# Patient Record
Sex: Male | Born: 1955 | Race: White | Hispanic: No | Marital: Married | Smoking: Current every day smoker
Health system: Southern US, Community
[De-identification: ages and names within clinical notes are randomized; demographics above are authoritative.]

## PROBLEM LIST (undated history)

## (undated) DIAGNOSIS — G4730 Sleep apnea, unspecified: Secondary | ICD-10-CM

## (undated) DIAGNOSIS — I2510 Atherosclerotic heart disease of native coronary artery without angina pectoris: Secondary | ICD-10-CM

## (undated) DIAGNOSIS — I209 Angina pectoris, unspecified: Secondary | ICD-10-CM

## (undated) DIAGNOSIS — I10 Essential (primary) hypertension: Secondary | ICD-10-CM

## (undated) DIAGNOSIS — K759 Inflammatory liver disease, unspecified: Secondary | ICD-10-CM

## (undated) DIAGNOSIS — M1990 Unspecified osteoarthritis, unspecified site: Secondary | ICD-10-CM

## (undated) HISTORY — PX: CARDIAC CATHETERIZATION: SHX172

## (undated) HISTORY — PX: JOINT REPLACEMENT: SHX530

## (undated) HISTORY — PX: TONSILLECTOMY: SUR1361

---

## 2021-08-30 ENCOUNTER — Other Ambulatory Visit: Payer: Self-pay

## 2021-08-30 ENCOUNTER — Encounter: Payer: Self-pay | Admitting: Family

## 2021-08-30 ENCOUNTER — Ambulatory Visit (INDEPENDENT_AMBULATORY_CARE_PROVIDER_SITE_OTHER): Payer: BC Managed Care – PPO | Admitting: Orthopaedic Surgery

## 2021-08-30 ENCOUNTER — Ambulatory Visit: Payer: Self-pay

## 2021-08-30 VITALS — Ht 67.5 in | Wt 215.0 lb

## 2021-08-30 DIAGNOSIS — M1712 Unilateral primary osteoarthritis, left knee: Secondary | ICD-10-CM | POA: Diagnosis not present

## 2021-08-30 DIAGNOSIS — G8929 Other chronic pain: Secondary | ICD-10-CM

## 2021-08-31 DIAGNOSIS — M1712 Unilateral primary osteoarthritis, left knee: Secondary | ICD-10-CM | POA: Insufficient documentation

## 2021-08-31 NOTE — Progress Notes (Signed)
Office Visit Note   Patient: David Livingston           Date of Birth: Nov 10, 1955           MRN: [id number] Visit Date: 08/30/2021              Requested by: No referring provider defined for this encounter. PCP: No primary care provider on file.   Assessment & Plan: Visit Diagnoses:  1. Primary osteoarthritis of left knee     Plan: I did review the x-rays with Kamrin and he does have bone-on-bone of the medial compartment with evidence of arthritis in the patellofemoral compartment as well.  There is mild degenerative changes of the lateral compartment.  Symptoms are primarily medial.  He is hopeful that a partial knee replacement is an option.  Given failure of conservative management he has elected to move forward with a partial versus a total knee replacement.  In order to determine this I will need an MRI first for presurgical planning.  He understands that if there is any evidence of DJD outside the medial compartment then a total knee replacement would be the most predictable and reliable in terms of pain relief and minimizing risk of revision surgery.  I will contact the patient once the MRI is available to discuss further steps.  Follow-Up Instructions: No follow-ups on file.   Orders:  Orders Placed This Encounter  Procedures   XR Knee 1-2 Views Left   MR Knee Left w/o contrast   No orders of the defined types were placed in this encounter.     Procedures: No procedures performed   Clinical Data: No additional findings.   Subjective: No chief complaint on file.   David Livingston is a very pleasant 66 year old gentleman who comes in to be evaluated for chronic severe left knee pain.  He underwent a partial medial knee replacement about 15 years ago in [place] and has done well from this.  He states that his left knee primarily hurts on the medial side but there is pain throughout the knee as well especially with increased activity.  He is hoping that a partial knee replacement is an  option for his left knee.  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  He is significantly limited by the pain for the left knee.  He is not interested in continuing medications or doing cortisone injections as these were ineffective in the right knee.   Review of Systems  Constitutional: Negative.   All other systems reviewed and are negative.   Objective: Vital Signs: Ht 5' 7.5" (1.715 m)    Wt 215 lb (97.5 kg)    BMI 33.18 kg/m   Physical Exam Vitals and nursing note reviewed.  Constitutional:      Appearance: He is well-developed.  Pulmonary:     Effort: Pulmonary effort is normal.  Abdominal:     Palpations: Abdomen is soft.  Skin:    General: Skin is warm.  Neurological:     Mental Status: He is alert and oriented to person, place, and time.  Psychiatric:        Behavior: Behavior normal.        Thought Content: Thought content normal.        Judgment: Judgment normal.    Ortho Exam  Examination of the left knee shows slight varus alignment.  No significant joint line tenderness.  There is patellofemoral crepitus with range of motion.  Range of motion is slightly  restricted.  Collaterals and cruciates are stable.  Specialty Comments:  No specialty comments available.  Imaging: No results found.   PMFS History: Patient Active Problem List   Diagnosis Date Noted   Primary osteoarthritis of left knee 08/31/2021   History reviewed. No pertinent past medical history.  History reviewed. No pertinent family history.  History reviewed. No pertinent surgical history. Social History   Occupational History   Not on file  Tobacco Use   Smoking status: Not on file   Smokeless tobacco: Not on file  Substance and Sexual Activity   Alcohol use: Not on file   Drug use: Not on file   Sexual activity: Not on file

## 2021-09-02 ENCOUNTER — Encounter: Payer: Self-pay | Admitting: Orthopaedic Surgery

## 2021-09-19 ENCOUNTER — Ambulatory Visit
Admission: RE | Admit: 2021-09-19 | Discharge: 2021-09-19 | Disposition: A | Discharge status: Home / Self Care | Payer: BC Managed Care – PPO | Source: Ambulatory Visit | Attending: Orthopaedic Surgery | Admitting: Orthopaedic Surgery

## 2021-09-19 ENCOUNTER — Other Ambulatory Visit: Payer: Self-pay

## 2021-09-19 DIAGNOSIS — M1712 Unilateral primary osteoarthritis, left knee: Secondary | ICD-10-CM

## 2021-10-21 ENCOUNTER — Encounter: Payer: Self-pay | Admitting: Orthopaedic Surgery

## 2021-10-21 NOTE — Telephone Encounter (Signed)
The labs are viewable in the care everywhere tab. ?

## 2021-10-23 NOTE — Pre-Procedure Instructions (Signed)
Surgical Instructions ? ? ? Your procedure is scheduled on Monday, March 27. ? Report to [facility] Main Entrance "A" at 8:00 A.M., then check in with the Admitting office. ? Call this number if you have problems the morning of surgery: ? [phone] ? ? If you have any questions prior to your surgery date call [phone]: Open Monday-Friday 8am-4pm ? ? ? Remember: ? Do not eat after midnight the night before your surgery ? ?You may drink clear liquids until 7:00AM the morning of your surgery.   ?Clear liquids allowed are: Water, Non-Citrus Juices (without pulp), Carbonated Beverages, Clear Tea, Black Coffee ONLY (NO MILK, CREAM OR POWDERED CREAMER of any kind), and Gatorade ? ?Patient Instructions ? ?The night before surgery:  ?No food after midnight. ONLY clear liquids after midnight ? ?The day of surgery (if you do NOT have diabetes):  ?Drink ONE (1) Pre-Surgery Clear Ensure by 7:00AM the morning of surgery. Drink in one sitting. Do not sip.  ?This drink was given to you during your hospital  ?pre-op appointment visit. ? ?Nothing else to drink after completing the  ?Pre-Surgery Clear Ensure. ? ?  ? Take these medicines the morning of surgery with A SIP OF WATER:  ? ?amLODipine (NORVASC) ?ezetimibe (ZETIA)  ?fluticasone (KLS ALLER-FLO)  if needed ? ?As of today, STOP taking any Aspirin (unless otherwise instructed by your surgeon) Aleve, Naproxen, Ibuprofen, Motrin, Advil, Goody's, BC's, all herbal medications, fish oil, and all vitamins. ? ?         ?Do not wear jewelry or makeup ?Do not wear lotions, powders, perfumes/colognes, or deodorant. ?Do not shave 48 hours prior to surgery.  Men may shave face and neck. ?Do not bring valuables to the hospital. ?Do not wear nail polish, gel polish, artificial nails, or any other type of covering on natural nails (fingers and toes) ?If you have artificial nails or gel coating that need to be removed by a nail salon, please have this removed prior to surgery. Artificial  nails or gel coating may interfere with anesthesia's ability to adequately monitor your vital signs. ? ?[facility] is not responsible for any belongings or valuables. .  ? ?Do NOT Smoke (Tobacco/Vaping)  24 hours prior to your procedure ? ?If you use a CPAP at night, you may bring your mask for your overnight stay. ?  ?Contacts, glasses, hearing aids, dentures or partials may not be worn into surgery, please bring cases for these belongings ?  ?For patients admitted to the hospital, discharge time will be determined by your treatment team. ?  ?Patients discharged the day of surgery will not be allowed to drive home, and someone needs to stay with them for 24 hours. ? ?NO VISITORS WILL BE ALLOWED IN PRE-OP WHERE PATIENTS ARE PREPPED FOR SURGERY.  ONLY 1 SUPPORT PERSON MAY BE PRESENT IN THE WAITING ROOM WHILE YOU ARE IN SURGERY.  IF YOU ARE TO BE ADMITTED, ONCE YOU ARE IN YOUR ROOM YOU WILL BE ALLOWED TWO (2) VISITORS. 1 (ONE) VISITOR MAY STAY OVERNIGHT BUT MUST ARRIVE TO THE ROOM BY 8pm.  Minor children may have two parents present. Special consideration for safety and communication needs will be reviewed on a case by case basis. ? ?Special instructions:   ? ?Oral Hygiene is also important to reduce your risk of infection.  Remember - BRUSH YOUR TEETH THE MORNING OF SURGERY WITH YOUR REGULAR TOOTHPASTE ? ? ?[facility]- Preparing For Surgery ? ?Before surgery, you can play an important  role. Because skin is not sterile, your skin needs to be as free of germs as possible. You can reduce the number of germs on your skin by washing with CHG (chlorahexidine gluconate) Soap before surgery.  CHG is an antiseptic cleaner which kills germs and bonds with the skin to continue killing germs even after washing.   ? ? ?Please do not use if you have an allergy to CHG or antibacterial soaps. If your skin becomes reddened/irritated stop using the CHG.  ?Do not shave (including legs and underarms) for at least 48 hours prior to  first CHG shower. It is OK to shave your face. ? ?Please follow these instructions carefully. ?  ? ? Shower the NIGHT BEFORE SURGERY and the MORNING OF SURGERY with CHG Soap.  ? If you chose to wash your hair, wash your hair first as usual with your normal shampoo. After you shampoo, rinse your hair and body thoroughly to remove the shampoo.  Then [employer] and genitals (private parts) with your normal soap and rinse thoroughly to remove soap. ? ?After that Use CHG Soap as you would any other liquid soap. You can apply CHG directly to the skin and wash gently with a scrungie or a clean washcloth.  ? ?Apply the CHG Soap to your body ONLY FROM THE NECK DOWN.  Do not use on open wounds or open sores. Avoid contact with your eyes, ears, mouth and genitals (private parts). Wash Face and genitals (private parts)  with your normal soap.  ? ?Wash thoroughly, paying special attention to the area where your surgery will be performed. ? ?Thoroughly rinse your body with warm water from the neck down. ? ?DO NOT shower/wash with your normal soap after using and rinsing off the CHG Soap. ? ?Pat yourself dry with a CLEAN TOWEL. ? ?Wear CLEAN PAJAMAS to bed the night before surgery ? ?Place CLEAN SHEETS on your bed the night before your surgery ? ?DO NOT SLEEP WITH PETS. ? ? ?Day of Surgery: ? ?Take a shower with CHG soap. ?Wear Clean/Comfortable clothing the morning of surgery ?Do not apply any deodorants/lotions.   ?Remember to brush your teeth WITH YOUR REGULAR TOOTHPASTE. ? ? ? ?COVID testing ? ?If you are going to stay overnight or be admitted after your procedure/surgery and require a pre-op COVID test, please follow these instructions after your COVID test  ? ?You are not required to quarantine however you are required to wear a well-fitting mask when you are out and around people not in your household.  If your mask becomes wet or soiled, replace with a new one. ? ?Wash your hands often with soap and water for 20 seconds or  clean your hands with an alcohol-based hand sanitizer that contains at least 60% alcohol. ? ?Do not share personal items. ? ?Notify your provider: ?if you are in close contact with someone who has COVID  ?or if you develop a fever of 100.4 or greater, sneezing, cough, sore throat, shortness of breath or body aches. ? ?  ?Please read over the following fact sheets that you were given.  ? ?

## 2021-10-24 ENCOUNTER — Encounter (HOSPITAL_COMMUNITY): Payer: Self-pay

## 2021-10-24 ENCOUNTER — Encounter (HOSPITAL_COMMUNITY)
Admission: RE | Admit: 2021-10-24 | Discharge: 2021-10-24 | Disposition: A | Discharge status: Home / Self Care | Payer: BC Managed Care – PPO | Source: Ambulatory Visit | Attending: Orthopaedic Surgery | Admitting: Orthopaedic Surgery

## 2021-10-24 ENCOUNTER — Other Ambulatory Visit: Payer: Self-pay

## 2021-10-24 VITALS — BP 128/83 | HR 66 | Temp 98.4°F | Resp 17 | Ht 69.0 in | Wt 214.0 lb

## 2021-10-24 DIAGNOSIS — Z20822 Contact with and (suspected) exposure to covid-19: Secondary | ICD-10-CM | POA: Diagnosis not present

## 2021-10-24 DIAGNOSIS — Z01818 Encounter for other preprocedural examination: Secondary | ICD-10-CM | POA: Diagnosis not present

## 2021-10-24 HISTORY — DX: Essential (primary) hypertension: I10

## 2021-10-24 HISTORY — DX: Angina pectoris, unspecified: I20.9

## 2021-10-24 HISTORY — DX: Inflammatory liver disease, unspecified: K75.9

## 2021-10-24 HISTORY — DX: Unspecified osteoarthritis, unspecified site: M19.90

## 2021-10-24 HISTORY — DX: Atherosclerotic heart disease of native coronary artery without angina pectoris: I25.10

## 2021-10-24 HISTORY — DX: Sleep apnea, unspecified: G47.30

## 2021-10-24 LAB — SURGICAL PCR SCREEN
MRSA, PCR: NEGATIVE
Staphylococcus aureus: NEGATIVE

## 2021-10-24 NOTE — Pre-Procedure Instructions (Addendum)
Surgical Instructions ? ? ? Your procedure is scheduled on Monday, March 27. ? Report to [facility] Main Entrance "A" at 8:00 A.M., then check in with the Admitting office. ? Call this number if you have problems the morning of surgery: ? [phone] ? ? If you have any questions prior to your surgery date call [phone]: Open Monday-Friday 8am-4pm ? ? ? Remember: ? Do not eat after midnight the night before your surgery ? ?You may drink clear liquids until 7:00AM the morning of your surgery.   ?Clear liquids allowed are: Water, Non-Citrus Juices (without pulp), Carbonated Beverages, Clear Tea, Black Coffee ONLY (NO MILK, CREAM OR POWDERED CREAMER of any kind), and Gatorade ? ?Patient Instructions ? ?The night before surgery:  ?No food after midnight. ONLY clear liquids after midnight ? ?The day of surgery (if you do NOT have diabetes):  ?Drink ONE (1) Pre-Surgery Clear Ensure by 7:00AM the morning of surgery. Drink in one sitting. Do not sip.  ?This drink was given to you during your hospital  ?pre-op appointment visit. ? ?Nothing else to drink after completing the  ?Pre-Surgery Clear Ensure. ? ?  ? Take these medicines the morning of surgery with A SIP OF WATER:  ? ?amLODipine (NORVASC) ?ezetimibe (ZETIA)  ?fluticasone (KLS ALLER-FLO)  if needed ? ? ?Follow your surgeon's instructions on when to stop Aspirin.  If no instructions were given by your surgeon then you will need to call the office to get those instructions.    ? ?As of today, STOP taking any Aspirin (unless otherwise instructed by your surgeon) Aleve, Naproxen, Ibuprofen, Motrin, Advil, Goody's, BC's, all herbal medications, fish oil, and all vitamins. ? ?  ?The day of surgery: ?        ?Do not wear jewelry  ?Do not wear lotions, powders, colognes, or deodorant. ?Men may shave face and neck. ?Do not bring valuables to the hospital. ? ? ?[facility] is not responsible for any belongings or valuables. .  ? ?Do NOT Smoke (Tobacco/Vaping)  24 hours  prior to your procedure ? ?If you use a CPAP at night, you may bring your mask for your overnight stay. ?  ?Contacts, glasses, hearing aids, dentures or partials may not be worn into surgery, please bring cases for these belongings ?  ?For patients admitted to the hospital, discharge time will be determined by your treatment team. ?  ?Patients discharged the day of surgery will not be allowed to drive home, and someone needs to stay with them for 24 hours. ? ?NO VISITORS WILL BE ALLOWED IN PRE-OP WHERE PATIENTS ARE PREPPED FOR SURGERY.  ONLY 1 SUPPORT PERSON MAY BE PRESENT IN THE WAITING ROOM WHILE YOU ARE IN SURGERY.  IF YOU ARE TO BE ADMITTED, ONCE YOU ARE IN YOUR ROOM YOU WILL BE ALLOWED TWO (2) VISITORS. 1 (ONE) VISITOR MAY STAY OVERNIGHT BUT MUST ARRIVE TO THE ROOM BY 8pm.  Minor children may have two parents present. Special consideration for safety and communication needs will be reviewed on a case by case basis. ? ?Special instructions:   ? ?Oral Hygiene is also important to reduce your risk of infection.  Remember - BRUSH YOUR TEETH THE MORNING OF SURGERY WITH YOUR REGULAR TOOTHPASTE ? ? ?[facility]- Preparing For Surgery ? ?Before surgery, you can play an important role. Because skin is not sterile, your skin needs to be as free of germs as possible. You can reduce the number of germs on your skin by washing with  CHG (chlorahexidine gluconate) Soap before surgery.  CHG is an antiseptic cleaner which kills germs and bonds with the skin to continue killing germs even after washing.   ? ? ?Please do not use if you have an allergy to CHG or antibacterial soaps. If your skin becomes reddened/irritated stop using the CHG.  ?Do not shave (including legs and underarms) for at least 48 hours prior to first CHG shower. It is OK to shave your face. ? ?Please follow these instructions carefully. ?  ? ? Shower the NIGHT BEFORE SURGERY and the MORNING OF SURGERY with CHG Soap.  ? If you chose to wash your hair, wash  your hair first as usual with your normal shampoo. After you shampoo, rinse your hair and body thoroughly to remove the shampoo.  Then [employer] and genitals (private parts) with your normal soap and rinse thoroughly to remove soap. ? ?After that Use CHG Soap as you would any other liquid soap. You can apply CHG directly to the skin and wash gently with a scrungie or a clean washcloth.  ? ?Apply the CHG Soap to your body ONLY FROM THE NECK DOWN.  Do not use on open wounds or open sores. Avoid contact with your eyes, ears, mouth and genitals (private parts). Wash Face and genitals (private parts)  with your normal soap.  ? ?Wash thoroughly, paying special attention to the area where your surgery will be performed. ? ?Thoroughly rinse your body with warm water from the neck down. ? ?DO NOT shower/wash with your normal soap after using and rinsing off the CHG Soap. ? ?Pat yourself dry with a CLEAN TOWEL. ? ?Wear CLEAN PAJAMAS to bed the night before surgery ? ?Place CLEAN SHEETS on your bed the night before your surgery ? ?DO NOT SLEEP WITH PETS. ? ? ?Day of Surgery: ? ?Take a shower with CHG soap. ?Wear Clean/Comfortable clothing the morning of surgery ?Do not apply any deodorants/lotions.   ?Remember to brush your teeth WITH YOUR REGULAR TOOTHPASTE. ? ?  ?Please read over the following fact sheets that you were given.  ? ?

## 2021-10-24 NOTE — Progress Notes (Signed)
PCP - [facility] ?Cardiologist - denies ? ?PPM/ICD - denies ?Device Orders - n/a ?Rep Notified - n/a ? ?Chest x-[place] - n/a ?EKG - 10/24/2021 ?Stress Test - 10 years ago ?ECHO - 10 years ago ?Cardiac Cath - 2005 (in [place]) and 2013 (in [place]) ? ?Sleep Study - yes, positive for OSA ?CPAP - yes - 9 ? ?Fasting Blood Sugar - n/a ? ?Blood Thinner Instructions: n/a ? ?Aspirin Instructions: Patient was instructed to call MD and ask if he needs to hold Aspirin prior surgery. Patient verbalized understanding. ? ?Patient was instructed: As of today, STOP taking any Aspirin (unless otherwise instructed by your surgeon) Aleve, Naproxen, Ibuprofen, Motrin, Advil, Goody's, BC's, all herbal medications, fish oil, and all vitamins. ?  ? ?ERAS Protcol - yes ?PRE-SURGERY Ensure - yes, until 07:00 o'clock ? ?COVID TEST- yes, patient is using CPAP ? ? ?Anesthesia review: yes - cardiac history - patient moved recently from [place] and he is new patient at [facility]. Records were requested from [facility] in [place]. Per patient, Dr. Roda Shutters is OK with labs that patient had done on 10/15/2011. No additional labs requested. ? ?Patient denies shortness of breath, fever, cough and chest pain at PAT appointment ? ? ?All instructions explained to the patient, with a verbal understanding of the material. Patient agrees to go over the instructions while at home for a better understanding. Patient also instructed to self quarantine after being tested for COVID-19. The opportunity to ask questions was provided. ?  ?

## 2021-10-25 LAB — SARS CORONAVIRUS 2 (TAT 6-24 HRS): SARS Coronavirus 2: NEGATIVE

## 2021-10-28 ENCOUNTER — Other Ambulatory Visit: Payer: Self-pay | Admitting: Physician Assistant

## 2021-10-28 MED ORDER — ONDANSETRON HCL 4 MG PO TABS: 4.0000 mg | ORAL_TABLET | Freq: Three times a day (TID) | ORAL | 0 refills | Status: DC | PRN

## 2021-10-28 MED ORDER — ASPIRIN EC 81 MG PO TBEC: 81.0000 mg | DELAYED_RELEASE_TABLET | Freq: Two times a day (BID) | ORAL | 0 refills | Status: AC

## 2021-10-28 MED ORDER — OXYCODONE-ACETAMINOPHEN 5-325 MG PO TABS: 1.0000 | ORAL_TABLET | Freq: Four times a day (QID) | ORAL | 0 refills | Status: DC | PRN

## 2021-10-28 MED ORDER — DOCUSATE SODIUM 100 MG PO CAPS: 100.0000 mg | ORAL_CAPSULE | Freq: Every day | ORAL | 2 refills | Status: AC | PRN

## 2021-10-28 MED ORDER — METHOCARBAMOL 500 MG PO TABS: 500.0000 mg | ORAL_TABLET | Freq: Two times a day (BID) | ORAL | 2 refills | Status: DC | PRN

## 2021-10-28 NOTE — Progress Notes (Addendum)
Anesthesia Chart Review: ? Case: [id number] Date/Time: 11/04/21 1100  ? Procedure: LEFT TOTAL KNEE ARTHROPLASTY (Left: Knee)  ? Anesthesia type: Spinal  ? Pre-op diagnosis: LEFT KNEE DEGENERATIVE JOINT DISEASE  ? Location: [facility] OR ROOM 04 / [facility] OR  ? Surgeons: Tarry Kos, MD  ? ?  ? ? ?DISCUSSION: Patient is a 66 year old male scheduled for the above procedure.  He will be 66 years old by his surgery date. ? ?History includes smoking, HTN, CAD/angina (DIAG1 stent ~ 2005 in [place]; DES dRCA & cutting balloon angioplasty D1 for in-stent restenosis 10/30/10 in [place]), OSA (uses CPAP), hepatitis (hepatitis C, s/p treatment per [place] records). Possible TIA (09/2018 per [place] records). 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 ? ?He recently moved from [place], and is a new patient at [facility]. He had an initial visit with Dr. Vanessa Barbara on 10/14/21. He notes patient with CAD history with PCI to DIAG and RCA in the past. At that time, he felt patient was stable from a cardiac standpoint--denied chest pain, SOB, palpitations, dizziness. He added, "He does not exercise as much as he would like to, uses recumbent bike intermittently but recently his left knee has been giving him a lot of trouble. He recently saw orthopedic surgery in [facility] where MRI revealed tricompartmental osteoarthritis and has been scheduled for left knee replacement on 11/04/2021." He recommended patient restart ASA and continue statin and ARB. He referred patient to cardiology to get re-established.  He has an appointment with [facility] cardiologist Dr. Karren Burly on 11/19/21 as a new patient. Smoker since age 75 and was smoking 1/2 per day.  ? ?Cardiology from [facility] in [place], [place] notes requested and received on 10/29/21.  He last saw his former cardiologist Samara Deist Pitone-Lipkin, DO on 12/07/19. He was asymptomatic from a CV and neurological standpoint. EKG then showed SR, septal infarct (similar  to 10/24/21 tracing except more recent tracing also shows low or loss or r wave in V3). Cardiac cath report from 10/30/10 received. He underwent DES dRCA and PCI with cutting balloon angioplasty of the D1 for in-stent restenosis. Cath was done at [facility]. [facility] which is now a [facility] of [facility] which is in Care Everywhere (although no cardiac records were found in Care Everywhere). [place] primary care note do indicate that his hepatitis was hepatitis C, s/p treatment. Cardiology note also suggest he was seen on 10/07/18 for diplopia at [facility] ED with unremarkable head CT and CTA, but he declined a MRI and left without completing work-up as symptoms resolved. Echo and ambulatory cardiac monitor was ordered by Dr. Pete Glatter. No afib was identified on Zio monitor and unremarkable echo on 10/23/18. He reported last stress test was 10 years ago (likely prior to his 2012 cath which showed anterolateral ischemia, and he subsequently underwent 2 vessel PCI). ? ?Patient is for elective left TKA. Known CAD, smoking. Last cardiology follow-up was almost two years ago, although felt stable by his PCP at recent 10/14/21 visit to get established. He has a pending visit next month to get established with a new cardiologist. Discussed with anesthesiologist Autumn Patty, MD. Would recommend preoperative cardiology input. I will notify Debbie at Dr. Warren Danes office.  ? ?ADDENDUM 10/30/21 3:32 PM: ?Patient could not get worked in sooner to see cardiologist Dr. Karren Burly, so he was evaluated by cardiologist Dr. Sharyn Lull at [facility] in [place] on 10/30/21. With patient's consent,  cardiology records from [place] were forwarded to Dr. Sharyn LullHarwani for his review. Patient denied CV symptoms and reported using his bike every day to prepare for surgery and had lost approximately 25 pounds in the last 6 months.  He had not required nitroglycerin.  Smoking was down to 1/4 pack a day.  Dr.  Sharyn LullHarwani wrote, "Patient is acceptable risk for left total knee replacement from cardiac point of view." ? ? ?VS: BP 128/83   Pulse 66   Temp 36.9 ?C (Oral)   Resp 17   Ht 5\' 9"  (1.753 m)   Wt 97.1 kg   SpO2 99%   BMI 31.60 kg/m?  ? ? ?PROVIDERS: ?- Jeralyn BennettZamora, Ezequiel, MD is PCP ?- Rinaldo CloudHarwani, Mohan, MD is cardiologist currently. He was evaluated on 10/30/21 for preoperative cardiology evaluation. Prior to that he had a pending new patient evaluation scheduled for 11/19/21 with [facility] cardiologist Bertram Galahandra, Madhulika, MD ? ? ?LABS: Last lab results from 10/15/11 ([facility]) include a CMET and CBC showing: Sodium 140, potassium 4.5, BUN 16, glucose 89, creatinine 0.84, calcium 9.7, total protein 6.9, albumin 4.5, total bilirubin 0.8, alkaline phosphatase 53, AST 14, ALT 15, WBC 8.4, hemoglobin 13.8, hematocrit 40.3, platelet count 236. ? ? ? ?IMAGES: ?MRI Left knee 09/19/21: ?IMPRESSION: ?1. Severe tricompartmental osteoarthritis prominent in the medial ?tibiofemoral with complete loss of meniscus, articular cartilage and ?bone-on-bone articulation with mild bone marrow edema. ?2.  Moderate joint effusion, likely reactive. ?3.  Large Baker's cyst measuring 3.5 x 1.5 x 10 cm. ?4.  Cruciate and collateral ligaments are intact. ?5.  No evidence of fracture or dislocation. ?  ? ?EKG: 10/24/21: ?Normal sinus rhythm ?Septal infarct , age undetermined ?Abnormal ECG ?No previous ECGs available ?Confirmed by Lance MussVaranasi, Jayadeep [phone]([id number]) on 10/26/2021 11:26:47 AM ? ? ?CV:  ?Echo 10/23/18 (Dr. Pete GlatterPitone-Lipkin): ?Conclusions: ?1.  Left ventricle: Normal left ventricular size.  Normal left ventricular systolic function.  No wall motion abnormalities are identified.  LV ejection fraction was 67%.  The LV ejection fraction was calculated using the biplane Simpson's rule method.  Mild left ventricular hypertrophy.  Findings consistent with concentric hypertrophy.  Left ventricular diastolic parameters were normal. ?2.  Right ventricle: Normal  right ventricular size and systolic function. ?3.  Atria: Normal left atrial size and morphology.  Normal right atrial size and morphology. ?4.  Atrial septum: The interatrial septum is normal in appearance. ?5.  Mitral valve: Mitral valve leaflets appear mildly thickened.  Mild mitral annular calcification.  Trivial mitral regurgitation. ?6.  Aortic valve: Aortic cusp appear mildly thickened.  No aortic regurgitation seen. ?7.  Tricuspid valve: Normal appearance of the tricuspid valve.  Trivial tricuspid regurgitation seen. ?8.  Pulmonic valve: Normal pulmonic valve appearance.  No evidence of pulmonic regurgitation. ?9.  Pericardium: Normal pericardium with no pericardial effusion. ?10.  Aorta: Normal aortic root. ?11.  IVC: The inferior vena cava is normal in size. ?12.  Pulmonary artery: Normal pulmonary artery size. ? ? ?Zio Monitor 10/19/18-10/26/18 (Dr. Pete GlatterPitone-Lipkin): ?#1.  The underlying rhythm is normal sinus.  The minimum heart rate is 51 bpm, maximum is 182 bpm, average is 73 bpm. ?2.  There are rare PACs, atrial couplets and triplets. ?3.  There are rare PVCs. ?4.  Sinus bradycardia and sinus tachycardia noted. ?5.  There were 9 runs of PSVT, ranging from 4-18 beats. ?6.  Monitor was not triggered. ?Impressions: Normal sinus rhythm, no atrial fibrillation was noted. ? ? ?Cardiac cath 10/30/10 [facility] [place]): ?Findings: ?Left main: Left main patent and normal.  Trifurcating into LAD, left circumflex, and ramus intermedius. ?LAD: Patent in the proximal, mid and distal segments.  It gave rise to [facility] which had a stent.  There was in-stent restenosis of around 70%.  DIAG2 was patent but a small vessel. ?LCx: Patent proximal, mid and distal segments.  It gave rise to small OM1 patent and OM2 with intermediate size and patent. ?Ramus intermedius: Small vessel but patent. ?RCA: Patent in the proximal segment. The mid segment had around 30 to 40% stenosis.  Distally it had around 70%  stenosis, which was intervened.  It was a right coronary dominant circulation, giving rise to PDA and posterolateral branches.  There was mild disease in the PDA but overall patent. ?Left Ventriculography: In RAO

## 2021-10-29 ENCOUNTER — Encounter (HOSPITAL_COMMUNITY): Payer: Self-pay

## 2021-10-30 NOTE — Anesthesia Preprocedure Evaluation (Addendum)
Anesthesia Evaluation  ?Patient identified by MRN, date of birth, ID band ?Patient awake ? ? ? ?Reviewed: ?Allergy & Precautions, NPO status , Patient's Chart, lab work & pertinent test results ? ?Airway ?Mallampati: II ? ?TM Distance: >3 FB ?Neck ROM: Full ? ? ? Dental ?no notable dental hx. ? ?  ?Pulmonary ?sleep apnea , Current Smoker,  ?  ?Pulmonary exam normal ?breath sounds clear to auscultation ? ? ? ? ? ? Cardiovascular ?hypertension, Pt. on medications ?Normal cardiovascular exam ?Rhythm:Regular Rate:Normal ? ? ?  ?Neuro/Psych ?negative neurological ROS ? negative psych ROS  ? GI/Hepatic ?negative GI ROS, Neg liver ROS,   ?Endo/Other  ?negative endocrine ROS ? Renal/GU ?negative Renal ROS  ?negative genitourinary ?  ?Musculoskeletal ? ?(+) Arthritis , Osteoarthritis,   ? Abdominal ?  ?Peds ?negative pediatric ROS ?(+)  Hematology ?negative hematology ROS ?(+)   ?Anesthesia Other Findings ? ? Reproductive/Obstetrics ?negative OB ROS ? ?  ? ? ? ? ? ? ? ? ? ? ? ? ? ?  ?  ? ? ? ? ? ? ? ?Anesthesia Physical ?Anesthesia Plan ? ?ASA: 2 ? ?Anesthesia Plan: Spinal  ? ?Post-op Pain Management: Regional block*  ? ?Induction: Intravenous ? ?PONV Risk Score and Plan: 1 and Propofol infusion and Treatment may vary due to age or medical condition ? ?Airway Management Planned: Simple Face Mask ? ?Additional Equipment:  ? ?Intra-op Plan:  ? ?Post-operative Plan:  ? ?Informed Consent: I have reviewed the patients History and Physical, chart, labs and discussed the procedure including the risks, benefits and alternatives for the proposed anesthesia with the patient or authorized representative who has indicated his/her understanding and acceptance.  ? ? ? ?Dental advisory given ? ?Plan Discussed with: CRNA and Surgeon ? ?Anesthesia Plan Comments: (PAT note written by Shonna Chock, PA-C. Preoperative cardiology clearance per Dr. Sharyn Lull 10/30/21.  ?)  ? ? ? ? ? ?Anesthesia Quick  Evaluation ? ?

## 2021-11-01 MED ORDER — TRANEXAMIC ACID 1000 MG/10ML IV SOLN
2000.0000 mg | INTRAVENOUS | Status: AC
  Filled 2021-11-01: qty 20

## 2021-11-01 NOTE — Progress Notes (Signed)
Pt notified of time change, arrival at 0845 ?

## 2021-11-04 ENCOUNTER — Encounter (HOSPITAL_COMMUNITY)
Admission: RE | Disposition: A | Discharge status: Home Health | Payer: Self-pay | Source: Home / Self Care | Attending: Orthopaedic Surgery

## 2021-11-04 ENCOUNTER — Observation Stay (HOSPITAL_COMMUNITY)
Admission: RE | Admit: 2021-11-04 | Discharge: 2021-11-05 | Disposition: A | Discharge status: Home Health | Payer: BC Managed Care – PPO | Attending: Orthopaedic Surgery | Admitting: Orthopaedic Surgery

## 2021-11-04 ENCOUNTER — Encounter (HOSPITAL_COMMUNITY): Payer: Self-pay | Admitting: Orthopaedic Surgery

## 2021-11-04 ENCOUNTER — Ambulatory Visit (HOSPITAL_COMMUNITY): Payer: BC Managed Care – PPO | Admitting: Emergency Medicine

## 2021-11-04 ENCOUNTER — Other Ambulatory Visit: Payer: Self-pay

## 2021-11-04 ENCOUNTER — Observation Stay (HOSPITAL_COMMUNITY): Payer: BC Managed Care – PPO

## 2021-11-04 ENCOUNTER — Ambulatory Visit (HOSPITAL_COMMUNITY): Payer: BC Managed Care – PPO | Admitting: Anesthesiology

## 2021-11-04 DIAGNOSIS — M1712 Unilateral primary osteoarthritis, left knee: Principal | ICD-10-CM | POA: Diagnosis present

## 2021-11-04 DIAGNOSIS — Z7982 Long term (current) use of aspirin: Secondary | ICD-10-CM | POA: Insufficient documentation

## 2021-11-04 DIAGNOSIS — I10 Essential (primary) hypertension: Secondary | ICD-10-CM | POA: Diagnosis not present

## 2021-11-04 DIAGNOSIS — I2510 Atherosclerotic heart disease of native coronary artery without angina pectoris: Secondary | ICD-10-CM | POA: Diagnosis not present

## 2021-11-04 DIAGNOSIS — F17210 Nicotine dependence, cigarettes, uncomplicated: Secondary | ICD-10-CM | POA: Insufficient documentation

## 2021-11-04 DIAGNOSIS — Z96652 Presence of left artificial knee joint: Secondary | ICD-10-CM

## 2021-11-04 DIAGNOSIS — Z96651 Presence of right artificial knee joint: Secondary | ICD-10-CM | POA: Insufficient documentation

## 2021-11-04 DIAGNOSIS — Z79899 Other long term (current) drug therapy: Secondary | ICD-10-CM | POA: Diagnosis not present

## 2021-11-04 HISTORY — PX: TOTAL KNEE ARTHROPLASTY: SHX125

## 2021-11-04 LAB — CBC WITH DIFFERENTIAL/PLATELET
Abs Immature Granulocytes: 0.04 10*3/uL (ref 0.00–0.07)
Basophils Absolute: 0.1 10*3/uL (ref 0.0–0.1)
Basophils Relative: 1 %
Eosinophils Absolute: 0.3 10*3/uL (ref 0.0–0.5)
Eosinophils Relative: 3 %
HCT: 40 % (ref 39.0–52.0)
Hemoglobin: 13.8 g/dL (ref 13.0–17.0)
Immature Granulocytes: 0 %
Lymphocytes Relative: 31 %
Lymphs Abs: 3 10*3/uL (ref 0.7–4.0)
MCH: 31.2 pg (ref 26.0–34.0)
MCHC: 34.5 g/dL (ref 30.0–36.0)
MCV: 90.5 fL (ref 80.0–100.0)
Monocytes Absolute: 0.9 10*3/uL (ref 0.1–1.0)
Monocytes Relative: 9 %
Neutro Abs: 5.4 10*3/uL (ref 1.7–7.7)
Neutrophils Relative %: 56 %
Platelets: 269 10*3/uL (ref 150–400)
RBC: 4.42 MIL/uL (ref 4.22–5.81)
RDW: 12.7 % (ref 11.5–15.5)
WBC: 9.8 10*3/uL (ref 4.0–10.5)
nRBC: 0 % (ref 0.0–0.2)

## 2021-11-04 SURGERY — ARTHROPLASTY, KNEE, TOTAL
Anesthesia: Spinal | Site: Knee | Laterality: Left

## 2021-11-04 MED ORDER — CHLORHEXIDINE GLUCONATE 0.12 % MT SOLN
15.0000 mL | Freq: Once | OROMUCOSAL | Status: AC
  Administered 2021-11-04: 15 mL via OROMUCOSAL
  Filled 2021-11-04: qty 15

## 2021-11-04 MED ORDER — POVIDONE-IODINE 10 % EX SWAB
2.0000 "application " | Freq: Once | CUTANEOUS | Status: AC
  Administered 2021-11-04: 2 via TOPICAL

## 2021-11-04 MED ORDER — VANCOMYCIN HCL 1000 MG IV SOLR
INTRAVENOUS | Status: DC | PRN
  Administered 2021-11-04: 1000 mg via TOPICAL

## 2021-11-04 MED ORDER — METOCLOPRAMIDE HCL 5 MG PO TABS: 5.0000 mg | ORAL_TABLET | Freq: Three times a day (TID) | ORAL | Status: DC | PRN

## 2021-11-04 MED ORDER — SODIUM CHLORIDE 0.9 % IV SOLN: INTRAVENOUS | Status: DC

## 2021-11-04 MED ORDER — PRONTOSAN WOUND IRRIGATION OPTIME
TOPICAL | Status: DC | PRN
  Administered 2021-11-04: 1 via TOPICAL

## 2021-11-04 MED ORDER — PROPOFOL 10 MG/ML IV BOLUS
INTRAVENOUS | Status: DC | PRN
Start: 2021-11-04 — End: 2021-11-04
  Administered 2021-11-04: 20 mg via INTRAVENOUS

## 2021-11-04 MED ORDER — BUPIVACAINE IN DEXTROSE 0.75-8.25 % IT SOLN
INTRATHECAL | Status: DC | PRN
  Administered 2021-11-04: 1.8 mL via INTRATHECAL

## 2021-11-04 MED ORDER — PHENOL 1.4 % MT LIQD: 1.0000 | OROMUCOSAL | Status: DC | PRN

## 2021-11-04 MED ORDER — CEFAZOLIN SODIUM-DEXTROSE 2-4 GM/100ML-% IV SOLN
2.0000 g | INTRAVENOUS | Status: AC
  Administered 2021-11-04: 2 g via INTRAVENOUS
  Filled 2021-11-04: qty 100

## 2021-11-04 MED ORDER — HYDROMORPHONE HCL 1 MG/ML IJ SOLN
0.5000 mg | INTRAMUSCULAR | Status: DC | PRN
  Administered 2021-11-04: 1 mg via INTRAVENOUS
  Filled 2021-11-04: qty 1

## 2021-11-04 MED ORDER — DEXAMETHASONE SODIUM PHOSPHATE 10 MG/ML IJ SOLN
10.0000 mg | Freq: Once | INTRAMUSCULAR | Status: AC
  Administered 2021-11-05: 10 mg via INTRAVENOUS
  Filled 2021-11-04: qty 1

## 2021-11-04 MED ORDER — ACETAMINOPHEN 325 MG PO TABS: 325.0000 mg | ORAL_TABLET | Freq: Four times a day (QID) | ORAL | Status: DC | PRN

## 2021-11-04 MED ORDER — METHOCARBAMOL 500 MG PO TABS
500.0000 mg | ORAL_TABLET | Freq: Four times a day (QID) | ORAL | Status: DC | PRN
  Administered 2021-11-04 – 2021-11-05 (×2): 500 mg via ORAL
  Filled 2021-11-04 (×2): qty 1

## 2021-11-04 MED ORDER — SODIUM CHLORIDE 0.9 % IR SOLN
Status: DC | PRN
  Administered 2021-11-04: 1000 mL

## 2021-11-04 MED ORDER — ASPIRIN 81 MG PO CHEW
81.0000 mg | CHEWABLE_TABLET | Freq: Two times a day (BID) | ORAL | Status: DC
  Administered 2021-11-04 – 2021-11-05 (×2): 81 mg via ORAL
  Filled 2021-11-04 (×2): qty 1

## 2021-11-04 MED ORDER — BUPIVACAINE-MELOXICAM ER 400-12 MG/14ML IJ SOLN
INTRAMUSCULAR | Status: AC
  Filled 2021-11-04: qty 1

## 2021-11-04 MED ORDER — PHENYLEPHRINE 40 MCG/ML (10ML) SYRINGE FOR IV PUSH (FOR BLOOD PRESSURE SUPPORT)
PREFILLED_SYRINGE | INTRAVENOUS | Status: DC | PRN
Start: 2021-11-04 — End: 2021-11-04
  Administered 2021-11-04: 120 ug via INTRAVENOUS

## 2021-11-04 MED ORDER — ONDANSETRON HCL 4 MG/2ML IJ SOLN: 4.0000 mg | Freq: Once | INTRAMUSCULAR | Status: DC | PRN

## 2021-11-04 MED ORDER — KETOROLAC TROMETHAMINE 15 MG/ML IJ SOLN
15.0000 mg | Freq: Four times a day (QID) | INTRAMUSCULAR | Status: DC
  Administered 2021-11-04 – 2021-11-05 (×3): 15 mg via INTRAVENOUS
  Filled 2021-11-04 (×3): qty 1

## 2021-11-04 MED ORDER — TRANEXAMIC ACID-NACL 1000-0.7 MG/100ML-% IV SOLN
1000.0000 mg | Freq: Once | INTRAVENOUS | Status: AC
  Administered 2021-11-04: 1000 mg via INTRAVENOUS
  Filled 2021-11-04: qty 100

## 2021-11-04 MED ORDER — ONDANSETRON HCL 4 MG/2ML IJ SOLN: 4.0000 mg | Freq: Four times a day (QID) | INTRAMUSCULAR | Status: DC | PRN

## 2021-11-04 MED ORDER — PHENYLEPHRINE 40 MCG/ML (10ML) SYRINGE FOR IV PUSH (FOR BLOOD PRESSURE SUPPORT)
PREFILLED_SYRINGE | INTRAVENOUS | Status: AC
  Filled 2021-11-04: qty 10

## 2021-11-04 MED ORDER — CEFAZOLIN SODIUM-DEXTROSE 2-4 GM/100ML-% IV SOLN
2.0000 g | Freq: Four times a day (QID) | INTRAVENOUS | Status: AC
  Administered 2021-11-04 (×2): 2 g via INTRAVENOUS
  Filled 2021-11-04 (×2): qty 100

## 2021-11-04 MED ORDER — PROPOFOL 500 MG/50ML IV EMUL
INTRAVENOUS | Status: DC | PRN
  Administered 2021-11-04: 125 ug/kg/min via INTRAVENOUS

## 2021-11-04 MED ORDER — MIDAZOLAM HCL 2 MG/2ML IJ SOLN: 2.0000 mg | Freq: Once | INTRAMUSCULAR | Status: AC

## 2021-11-04 MED ORDER — BUPIVACAINE-MELOXICAM ER 400-12 MG/14ML IJ SOLN
INTRAMUSCULAR | Status: DC | PRN
  Administered 2021-11-04: 400 mg

## 2021-11-04 MED ORDER — BENAZEPRIL HCL 20 MG PO TABS
20.0000 mg | ORAL_TABLET | Freq: Every day | ORAL | Status: DC
  Administered 2021-11-04: 20 mg via ORAL
  Filled 2021-11-04 (×2): qty 1

## 2021-11-04 MED ORDER — TRANEXAMIC ACID 1000 MG/10ML IV SOLN
INTRAVENOUS | Status: DC | PRN
  Administered 2021-11-04: 2000 mg via TOPICAL

## 2021-11-04 MED ORDER — ACETAMINOPHEN 10 MG/ML IV SOLN: 1000.0000 mg | Freq: Once | INTRAVENOUS | Status: DC | PRN

## 2021-11-04 MED ORDER — METOCLOPRAMIDE HCL 5 MG/ML IJ SOLN: 5.0000 mg | Freq: Three times a day (TID) | INTRAMUSCULAR | Status: DC | PRN

## 2021-11-04 MED ORDER — OXYCODONE HCL 5 MG PO TABS
5.0000 mg | ORAL_TABLET | ORAL | Status: DC | PRN
  Administered 2021-11-04 – 2021-11-05 (×2): 10 mg via ORAL
  Filled 2021-11-04: qty 2

## 2021-11-04 MED ORDER — ACETAMINOPHEN 500 MG PO TABS
1000.0000 mg | ORAL_TABLET | Freq: Four times a day (QID) | ORAL | Status: AC
  Administered 2021-11-04 – 2021-11-05 (×4): 1000 mg via ORAL
  Filled 2021-11-04 (×4): qty 2

## 2021-11-04 MED ORDER — ONDANSETRON HCL 4 MG PO TABS
4.0000 mg | ORAL_TABLET | Freq: Four times a day (QID) | ORAL | Status: DC | PRN
Start: 2021-11-04 — End: 2021-11-05

## 2021-11-04 MED ORDER — AMLODIPINE BESYLATE 5 MG PO TABS
5.0000 mg | ORAL_TABLET | Freq: Every day | ORAL | Status: DC
  Administered 2021-11-04: 5 mg via ORAL
  Filled 2021-11-04: qty 1

## 2021-11-04 MED ORDER — MENTHOL 3 MG MT LOZG: 1.0000 | LOZENGE | OROMUCOSAL | Status: DC | PRN

## 2021-11-04 MED ORDER — DOCUSATE SODIUM 100 MG PO CAPS
100.0000 mg | ORAL_CAPSULE | Freq: Two times a day (BID) | ORAL | Status: DC
  Administered 2021-11-04 – 2021-11-05 (×3): 100 mg via ORAL
  Filled 2021-11-04 (×3): qty 1

## 2021-11-04 MED ORDER — LACTATED RINGERS IV SOLN: INTRAVENOUS | Status: DC

## 2021-11-04 MED ORDER — MIDAZOLAM HCL 2 MG/2ML IJ SOLN
INTRAMUSCULAR | Status: AC
  Administered 2021-11-04: 2 mg via INTRAVENOUS
  Filled 2021-11-04: qty 2

## 2021-11-04 MED ORDER — TAMSULOSIN HCL 0.4 MG PO CAPS
0.4000 mg | ORAL_CAPSULE | Freq: Every day | ORAL | Status: DC
Start: 2021-11-04 — End: 2021-11-05
  Administered 2021-11-04 – 2021-11-05 (×2): 0.4 mg via ORAL
  Filled 2021-11-04 (×2): qty 1

## 2021-11-04 MED ORDER — FENTANYL CITRATE (PF) 100 MCG/2ML IJ SOLN: 100.0000 ug | Freq: Once | INTRAMUSCULAR | Status: AC

## 2021-11-04 MED ORDER — FENTANYL CITRATE (PF) 100 MCG/2ML IJ SOLN
INTRAMUSCULAR | Status: AC
  Administered 2021-11-04: 100 ug via INTRAVENOUS
  Filled 2021-11-04: qty 2

## 2021-11-04 MED ORDER — HYDROMORPHONE HCL 1 MG/ML IJ SOLN: 0.2500 mg | INTRAMUSCULAR | Status: DC | PRN

## 2021-11-04 MED ORDER — BUPIVACAINE-EPINEPHRINE (PF) 0.5% -1:200000 IJ SOLN
INTRAMUSCULAR | Status: DC | PRN
  Administered 2021-11-04: 30 mL via PERINEURAL

## 2021-11-04 MED ORDER — 0.9 % SODIUM CHLORIDE (POUR BTL) OPTIME
TOPICAL | Status: DC | PRN
  Administered 2021-11-04: 1000 mL

## 2021-11-04 MED ORDER — TRANEXAMIC ACID-NACL 1000-0.7 MG/100ML-% IV SOLN
1000.0000 mg | INTRAVENOUS | Status: AC
  Administered 2021-11-04: 1000 mg via INTRAVENOUS
  Filled 2021-11-04: qty 100

## 2021-11-04 MED ORDER — VANCOMYCIN HCL 1000 MG IV SOLR
INTRAVENOUS | Status: AC
  Filled 2021-11-04: qty 20

## 2021-11-04 MED ORDER — ORAL CARE MOUTH RINSE: 15.0000 mL | Freq: Once | OROMUCOSAL | Status: AC

## 2021-11-04 MED ORDER — OXYCODONE HCL 5 MG PO TABS
10.0000 mg | ORAL_TABLET | ORAL | Status: DC | PRN
  Filled 2021-11-04: qty 2

## 2021-11-04 MED ORDER — OXYCODONE HCL ER 10 MG PO T12A
10.0000 mg | EXTENDED_RELEASE_TABLET | Freq: Two times a day (BID) | ORAL | Status: DC
  Administered 2021-11-04 – 2021-11-05 (×3): 10 mg via ORAL
  Filled 2021-11-04 (×3): qty 1

## 2021-11-04 MED ORDER — METHOCARBAMOL 1000 MG/10ML IJ SOLN: 500.0000 mg | Freq: Four times a day (QID) | INTRAVENOUS | Status: DC | PRN

## 2021-11-04 SURGICAL SUPPLY — 78 items
ALCOHOL 70% 16 OZ (MISCELLANEOUS) ×2 IMPLANT
BAG COUNTER SPONGE SURGICOUNT (BAG) IMPLANT
BAG DECANTER FOR FLEXI CONT (MISCELLANEOUS) ×2 IMPLANT
BANDAGE ESMARK 6X9 LF (GAUZE/BANDAGES/DRESSINGS) IMPLANT
BLADE SAG 18X100X1.27 (BLADE) ×2 IMPLANT
BNDG ESMARK 6X9 LF (GAUZE/BANDAGES/DRESSINGS)
BOWL SMART MIX CTS (DISPOSABLE) ×2 IMPLANT
CLSR STERI-STRIP ANTIMIC 1/2X4 (GAUZE/BANDAGES/DRESSINGS) ×4 IMPLANT
COMP FEM PS KNEE STD 10 LT (Knees) ×2 IMPLANT
COMP TIB KNEE PS 0D LT (Joint) ×2 IMPLANT
COMPONENT FEM PS KN STD 10 LT (Knees) IMPLANT
COMPONENT TIB KNEE PS 0D LT (Joint) IMPLANT
COOLER ICEMAN CLASSIC (MISCELLANEOUS) ×2 IMPLANT
COVER SURGICAL LIGHT HANDLE (MISCELLANEOUS) ×2 IMPLANT
CUFF TOURN SGL QUICK 34 (TOURNIQUET CUFF) ×1
CUFF TOURN SGL QUICK 42 (TOURNIQUET CUFF) IMPLANT
CUFF TRNQT CYL 34X4.125X (TOURNIQUET CUFF) ×1 IMPLANT
DERMABOND ADVANCED (GAUZE/BANDAGES/DRESSINGS) ×1
DERMABOND ADVANCED .7 DNX12 (GAUZE/BANDAGES/DRESSINGS) ×1 IMPLANT
DRAPE EXTREMITY T 121X128X90 (DISPOSABLE) ×2 IMPLANT
DRAPE HALF SHEET 40X57 (DRAPES) ×2 IMPLANT
DRAPE INCISE IOBAN 66X45 STRL (DRAPES) ×2 IMPLANT
DRAPE ORTHO SPLIT 77X108 STRL (DRAPES)
DRAPE POUCH INSTRU U-SHP 10X18 (DRAPES) ×2 IMPLANT
DRAPE SURG ORHT 6 SPLT 77X108 (DRAPES) ×2 IMPLANT
DRAPE U-SHAPE 47X51 STRL (DRAPES) ×4 IMPLANT
DRESSING AQUACEL AG SP 3.5X4 (GAUZE/BANDAGES/DRESSINGS) IMPLANT
DRSG AQUACEL AG ADV 3.5X10 (GAUZE/BANDAGES/DRESSINGS) ×2 IMPLANT
DRSG AQUACEL AG SP 3.5X4 (GAUZE/BANDAGES/DRESSINGS) ×2
DURAPREP 26ML APPLICATOR (WOUND CARE) ×6 IMPLANT
ELECT CAUTERY BLADE 6.4 (BLADE) ×2 IMPLANT
ELECT REM PT RETURN 9FT ADLT (ELECTROSURGICAL) ×2
ELECTRODE REM PT RTRN 9FT ADLT (ELECTROSURGICAL) ×1 IMPLANT
GLOVE BIOGEL PI IND STRL 7.5 (GLOVE) ×4 IMPLANT
GLOVE BIOGEL PI INDICATOR 7.5 (GLOVE) ×4
GLOVE SURG UNDER LTX SZ7.5 (GLOVE) ×4 IMPLANT
GLOVE SURG UNDER POLY LF SZ7 (GLOVE) ×6 IMPLANT
GOWN STRL REIN XL XLG (GOWN DISPOSABLE) ×2 IMPLANT
GOWN STRL REUS W/ TWL LRG LVL3 (GOWN DISPOSABLE) ×1 IMPLANT
GOWN STRL REUS W/TWL LRG LVL3 (GOWN DISPOSABLE) ×1
HANDPIECE INTERPULSE COAX TIP (DISPOSABLE) ×1
HDLS TROCR DRIL PIN KNEE 75 (PIN) ×1
HOOD PEEL AWAY FLYTE STAYCOOL (MISCELLANEOUS) ×4 IMPLANT
IMMOBILIZER KNEE 22 UNIV (SOFTGOODS) ×1 IMPLANT
IMPL PATELLA METAL SZ32X10 (Joint) ×1 IMPLANT
INSERT ASF POLY 14 LT (Insert) ×1 IMPLANT
JET LAVAGE IRRISEPT WOUND (IRRIGATION / IRRIGATOR) ×2
KIT BASIN OR (CUSTOM PROCEDURE TRAY) ×2 IMPLANT
KIT TURNOVER KIT B (KITS) ×2 IMPLANT
LAVAGE JET IRRISEPT WOUND (IRRIGATION / IRRIGATOR) ×1 IMPLANT
MANIFOLD NEPTUNE II (INSTRUMENTS) ×2 IMPLANT
MARKER SKIN DUAL TIP RULER LAB (MISCELLANEOUS) ×4 IMPLANT
NDL SPNL 18GX3.5 QUINCKE PK (NEEDLE) ×1 IMPLANT
NEEDLE SPNL 18GX3.5 QUINCKE PK (NEEDLE) ×2 IMPLANT
NS IRRIG 1000ML POUR BTL (IV SOLUTION) ×2 IMPLANT
PACK TOTAL JOINT (CUSTOM PROCEDURE TRAY) ×2 IMPLANT
PAD ARMBOARD 7.5X6 YLW CONV (MISCELLANEOUS) ×4 IMPLANT
PAD COLD SHLDR WRAP-ON (PAD) ×2 IMPLANT
PIN DRILL HDLS TROCAR 75 4PK (PIN) IMPLANT
SAW OSC TIP CART 19.5X105X1.3 (SAW) ×2 IMPLANT
SCREW FEMALE HEX FIX 25X2.5 (ORTHOPEDIC DISPOSABLE SUPPLIES) ×1 IMPLANT
SET HNDPC FAN SPRY TIP SCT (DISPOSABLE) ×1 IMPLANT
STAPLER VISISTAT 35W (STAPLE) IMPLANT
SUCTION FRAZIER HANDLE 10FR (MISCELLANEOUS) ×1
SUCTION TUBE FRAZIER 10FR DISP (MISCELLANEOUS) ×1 IMPLANT
SUT ETHILON 2 0 FS 18 (SUTURE) ×2 IMPLANT
SUT MNCRL AB 3-0 PS2 27 (SUTURE) IMPLANT
SUT VIC AB 0 CT1 27 (SUTURE) ×2
SUT VIC AB 0 CT1 27XBRD ANBCTR (SUTURE) ×2 IMPLANT
SUT VIC AB 1 CTX 27 (SUTURE) ×6 IMPLANT
SUT VIC AB 2-0 CT1 27 (SUTURE) ×4
SUT VIC AB 2-0 CT1 TAPERPNT 27 (SUTURE) ×4 IMPLANT
SYR 50ML LL SCALE MARK (SYRINGE) ×4 IMPLANT
TOWEL GREEN STERILE (TOWEL DISPOSABLE) ×2 IMPLANT
TOWEL GREEN STERILE FF (TOWEL DISPOSABLE) ×2 IMPLANT
TRAY CATH 16FR W/PLASTIC CATH (SET/KITS/TRAYS/PACK) IMPLANT
UNDERPAD 30X36 HEAVY ABSORB (UNDERPADS AND DIAPERS) ×2 IMPLANT
YANKAUER SUCT BULB TIP NO VENT (SUCTIONS) ×4 IMPLANT

## 2021-11-04 NOTE — Discharge Instructions (Signed)

## 2021-11-04 NOTE — TOC Initial Note (Signed)
Transition of Care (TOC) - Initial/Assessment Note  ? ? ?Patient Details  ?Name: David Livingston ?MRN: [id number] ?Date of Birth: Jan 02, 1956 ? ?Transition of Care (TOC) CM/SW Contact:    ?Kingsley Plan, RN ?Phone Number: ?11/04/2021, 2:06 PM ? ?Clinical Narrative:                 ? ?Dr Roda Shutters office has arranged home health services with [facility]. Confirmed with Clifton Custard with [facility].  ?  ?3C staff will provide any DME needed.  ? ? ?Transition of Care (TOC) Screening Note ? ? ?Patient Details  ?Name: David Livingston ?Date of Birth: February 27, 1956 ? ? ?Transition of Care Department [facility]) has reviewed patient and no TOC needs have been identified at this time. We will continue to monitor patient advancement through interdisciplinary progression rounds. If new patient transition needs arise, please place a TOC consult. ?  ?  ?  ? ? ?Patient Goals and CMS Choice ?  ?  ?  ? ?Expected Discharge Plan and Services ?  ?  ?  ?  ?  ?                ?  ?  ?  ?  ?  ?  ?  ?  ?  ?  ? ?Prior Living Arrangements/Services ?  ?  ?  ?       ?  ?  ?  ?  ? ?Activities of Daily Living ?  ?  ? ?Permission Sought/Granted ?  ?  ?   ?   ?   ?   ? ?Emotional Assessment ?  ?  ?  ?  ?  ?  ? ?Admission diagnosis:  Status post total left knee replacement [Z96.652] ?Patient Active Problem List  ? Diagnosis Date Noted  ? Status post total left knee replacement 11/04/2021  ? Primary osteoarthritis of left knee 08/31/2021  ? ?PCP:  Jeralyn Bennett, MD ?Pharmacy:   ?[facility] [phone] - [place], [place] - [phone] [facility] [address] ?[address] ?[place] [address] ?Phone: [phone] Fax: [phone] ? ? ? ? ?Social Determinants of Health (SDOH) Interventions ?  ? ?Readmission Risk Interventions ?   ? View : No data to display.  ?  ?  ?  ? ? ? ?

## 2021-11-04 NOTE — Anesthesia Procedure Notes (Signed)
Spinal ? ?Patient location during procedure: OR ?Start time: 11/04/2021 11:18 AM ?End time: 11/04/2021 11:22 AM ?Reason for block: surgical anesthesia ?Staffing ?Performed: anesthesiologist  ?Anesthesiologist: Eilene Ghazi, MD ?Preanesthetic Checklist ?Completed: patient identified, IV checked, site marked, risks and benefits discussed, surgical consent, monitors and equipment checked, pre-op evaluation and timeout performed ?Spinal Block ?Patient position: sitting ?Prep: Betadine ?Patient monitoring: heart rate, continuous pulse ox and blood pressure ?Approach: midline ?Location: L3-4 ?Injection technique: single-shot ?Needle ?Needle type: Sprotte  ?Needle gauge: 24 G ?Needle length: 9 cm ?Assessment ?Sensory level: T6 ?Events: CSF return ?Additional Notes ? ? ? ? ? ? ?

## 2021-11-04 NOTE — Anesthesia Procedure Notes (Signed)
Anesthesia Procedure Image    

## 2021-11-04 NOTE — Evaluation (Signed)
Physical Therapy Evaluation ?Patient Details ?Name: David Livingston ?MRN: [id number] ?DOB: [birth year] ?Today's Date: 11/04/2021 ? ?History of Present Illness ? Pt adm 3/27 for lt TKR. PMH - CAD, HTN, sleep apnea, partial rt knee replacement  ?Clinical Impression ? Pt admitted with above diagnosis and presents to PT with functional limitations due to deficits listed below (See PT problem list). Pt needs skilled PT to maximize independence and safety to allow discharge to home with wife.  ?   ?   ? ?Recommendations for follow up therapy are one component of a multi-disciplinary discharge planning process, led by the attending physician.  Recommendations may be updated based on patient status, additional functional criteria and insurance authorization. ? ?Follow Up Recommendations Follow physician's recommendations for discharge plan and follow up therapies ? ?  ?Assistance Recommended at Discharge Intermittent Supervision/Assistance  ?Patient can return home with the following ? Help with stairs or ramp for entrance ? ?  ?Equipment Recommendations None recommended by PT  ?Recommendations for Other Services ?    ?  ?Functional Status Assessment Patient has had a recent decline in their functional status and demonstrates the ability to make significant improvements in function in a reasonable and predictable amount of time.  ? ?  ?Precautions / Restrictions Precautions ?Precautions: Knee ?Restrictions ?Weight Bearing Restrictions: No  ? ?  ? ?Mobility ? Bed Mobility ?Overal bed mobility: Needs Assistance ?Bed Mobility: Supine to Sit ?  ?  ?Supine to sit: Min assist, HOB elevated ?  ?  ?General bed mobility comments: Assist to bring LLE off of bed ?  ? ?Transfers ?Overall transfer level: Needs assistance ?Equipment used: Rolling walker (2 wheels) ?Transfers: Sit to/from Stand ?Sit to Stand: Min guard ?  ?  ?  ?  ?  ?General transfer comment: Assist for safety and verbal cues for hand placement ?   ? ?Ambulation/Gait ?Ambulation/Gait assistance: Min guard ?Gait Distance (Feet): 125 Feet ?Assistive device: Rolling walker (2 wheels) ?Gait Pattern/deviations: Step-through pattern, Decreased stride length, Decreased stance time - left ?Gait velocity: decr ?Gait velocity interpretation: 1.31 - 2.62 ft/sec, indicative of limited community ambulator ?  ?General Gait Details: assist for safety ? ?Stairs ?  ?  ?  ?  ?  ? ?Wheelchair Mobility ?  ? ?Modified Rankin (Stroke Patients Only) ?  ? ?  ? ?Balance Overall balance assessment: No apparent balance deficits (not formally assessed) ?  ?  ?  ?  ?  ?  ?  ?  ?  ?  ?  ?  ?  ?  ?  ?  ?  ?  ?   ? ? ? ?Pertinent Vitals/Pain Pain Assessment ?Pain Assessment: 0-10 ?Pain Score: 2  ?Pain Location: lt knee ?Pain Descriptors / Indicators: Sore ?Pain Intervention(s): Limited activity within patient's tolerance, Monitored during session, Ice applied  ? ? ?Home Living Family/patient expects to be discharged to:: Private residence ?Living Arrangements: Spouse/significant other ?Available Help at Discharge: Family;Available 24 hours/day ?Type of Home: House ?Home Access: Stairs to enter ?Entrance Stairs-Rails: Right;Left;Can reach both ?Entrance Stairs-Number of Steps: 4-5 ?  ?Home Layout: One level ?Home Equipment: Conservation officer, nature (2 wheels) ?   ?  ?Prior Function Prior Level of Function : Independent/Modified Independent;Working/employed;Driving ?  ?  ?  ?  ?  ?  ?Mobility Comments: No assistive device ?  ?  ? ? ?Hand Dominance  ?   ? ?  ?Extremity/Trunk Assessment  ? Upper Extremity Assessment ?Upper Extremity Assessment: Overall WFL for tasks  assessed ?  ? ?Lower Extremity Assessment ?Lower Extremity Assessment: LLE deficits/detail ?LLE Deficits / Details: fair quad set, assist for SLR, AAROM 0-50 ?  ? ?   ?Communication  ? Communication: No difficulties  ?Cognition Arousal/Alertness: Awake/alert ?Behavior During Therapy: [facility] for tasks assessed/performed ?Overall Cognitive Status:  Within Functional Limits for tasks assessed ?  ?  ?  ?  ?  ?  ?  ?  ?  ?  ?  ?  ?  ?  ?  ?  ?  ?  ?  ? ?  ?General Comments   ? ?  ?Exercises Total Joint Exercises ?Ankle Circles/Pumps: AROM, Both, 5 reps, Seated ?Quad Sets: AROM, Left, 5 reps, Seated ?Knee Flexion: AAROM, Left, 5 reps, Seated ?Goniometric ROM: 0-50  ? ?Assessment/Plan  ?  ?PT Assessment Patient needs continued PT services  ?PT Problem List Decreased strength;Decreased range of motion;Decreased mobility;Pain ? ?   ?  ?PT Treatment Interventions DME instruction;Gait training;Stair training;Functional mobility training;Therapeutic activities;Therapeutic exercise;Patient/family education   ? ?PT Goals (Current goals can be found in the Care Plan section)  ?Acute Rehab PT Goals ?Patient Stated Goal: return home ?PT Goal Formulation: With patient ?Time For Goal Achievement: 11/08/21 ?Potential to Achieve Goals: Good ? ?  ?Frequency 7X/week ?  ? ? ?Co-evaluation   ?  ?  ?  ?  ? ? ?  ?AM-PAC PT "6 Clicks" Mobility  ?Outcome Measure Help needed turning from your back to your side while in a flat bed without using bedrails?: A Little ?Help needed moving from lying on your back to sitting on the side of a flat bed without using bedrails?: A Little ?Help needed moving to and from a bed to a chair (including a wheelchair)?: A Little ?Help needed standing up from a chair using your arms (e.g., wheelchair or bedside chair)?: A Little ?Help needed to walk in hospital room?: A Little ?Help needed climbing 3-5 steps with a railing? : A Little ?6 Click Score: 18 ? ?  ?End of Session Equipment Utilized During Treatment: Gait belt ?Activity Tolerance: Patient tolerated treatment well ?Patient left: in chair;with call bell/phone within reach ?Nurse Communication: Mobility status ?PT Visit Diagnosis: Other abnormalities of gait and mobility (R26.89);Pain ?Pain - Right/Left: Left ?Pain - part of body: Knee ?  ? ?Time: [id number] ?PT Time Calculation (min) (ACUTE ONLY): 31  min ? ? ?Charges:   PT Evaluation ?$PT Eval Low Complexity: 1 Low ?PT Treatments ?$Gait Training: 8-22 mins ?  ?   ? ? ?[facility] PT ?Acute Rehabilitation Services ?Pager [phone] ?Office [phone] ? ? ?Shary Decamp [facility] ?11/04/2021, 5:47 PM ? ?

## 2021-11-04 NOTE — Anesthesia Postprocedure Evaluation (Signed)
Anesthesia Post Note ? ?Patient: David Livingston ? ?Procedure(s) Performed: LEFT TOTAL KNEE ARTHROPLASTY (Left: Knee) ? ?  ? ?Patient location during evaluation: PACU ?Anesthesia Type: Spinal ?Level of consciousness: oriented and awake and alert ?Pain management: pain level controlled ?Vital Signs Assessment: post-procedure vital signs reviewed and stable ?Respiratory status: spontaneous breathing, respiratory function stable and patient connected to nasal cannula oxygen ?Cardiovascular status: blood pressure returned to baseline and stable ?Postop Assessment: no headache, no backache and no apparent nausea or vomiting ?Anesthetic complications: no ? ? ?No notable events documented. ? ?Last Vitals:  ?Vitals:  ? 11/04/21 1415 11/04/21 1430  ?BP: (!) 142/83 (!) 153/93  ?Pulse: (!) 52 (!) 55  ?Resp: 13 14  ?Temp:  36.8 ?C  ?SpO2: 100% 100%  ?  ?Last Pain:  ?Vitals:  ? 11/04/21 1430  ?TempSrc:   ?PainSc: 0-No pain  ? ? ?  ?  ?  ?  ?  ?  ? ?Ann Groeneveld S ? ? ? ? ?

## 2021-11-04 NOTE — H&P (Signed)
? ? ?PREOPERATIVE H&P ? ?Chief Complaint: LEFT KNEE DEGENERATIVE JOINT DISEASE ? ?HPI: ?David Livingston is a 66 y.o. male who presents for surgical treatment of LEFT KNEE DEGENERATIVE JOINT DISEASE.  He denies any changes in medical history. ? ?Past Medical History:  ?Diagnosis Date  ? Anginal pain (HCC)   ? Arthritis   ? Coronary artery disease   ? Hepatitis   ? Hypertension   ? Sleep apnea   ? ?Past Surgical History:  ?Procedure Laterality Date  ? CARDIAC CATHETERIZATION    ? ~ 2005 stent to D1; 10/30/10: DES dRCA with 0% residual stenosis & cutting balloon angioplasty D1 for ISR with 70% stenosis reduced to 30% ([facility], [place])  ? JOINT REPLACEMENT    ? partial right knee replacement  ? TONSILLECTOMY    ? ?Social History  ? ?Socioeconomic History  ? Marital status: Married  ?  Spouse name: Not on file  ? Number of children: Not on file  ? Years of education: Not on file  ? Highest education level: Not on file  ?Occupational History  ? Not on file  ?Tobacco Use  ? Smoking status: Every Day  ?  Packs/day: 0.25  ?  Types: Cigarettes  ? Smokeless tobacco: Never  ?Substance and Sexual Activity  ? Alcohol use: Yes  ?  Alcohol/week: 10.0 standard drinks  ?  Types: 10 Shots of liquor per week  ? Drug use: Never  ? Sexual activity: Not on file  ?Other Topics Concern  ? Not on file  ?Social History Narrative  ? Not on file  ? ?Social Determinants of Health  ? ?Financial Resource Strain: Not on file  ?Food Insecurity: Not on file  ?Transportation Needs: Not on file  ?Physical Activity: Not on file  ?Stress: Not on file  ?Social Connections: Not on file  ? ?History reviewed. No pertinent family history. ?No Known Allergies ?Prior to Admission medications   ?Medication Sig Start Date End Date Taking? Authorizing Provider  ?amLODipine (NORVASC) 5 MG tablet Take 5 mg by mouth daily. 08/21/21  Yes [provider]  ?aspirin EC 81 MG tablet Take 1 tablet (81 mg total) by mouth 2 (two) times daily. To be taken  after surgery to prevent blood clots 10/28/21  Yes Jari Sportsman L, PA-C  ?benazepril (LOTENSIN) 20 MG tablet Take 20 mg by mouth daily. 08/21/21  Yes [provider]  ?cholecalciferol (VITAMIN D3) 25 MCG (1000 UNIT) tablet Take 1,000 Units by mouth daily.   Yes [provider]  ?docusate sodium (COLACE) 100 MG capsule Take 1 capsule (100 mg total) by mouth daily as needed. 10/28/21 10/28/22  Cristie Hem, PA-C  ?ezetimibe (ZETIA) 10 MG tablet Take 10 mg by mouth daily. 08/21/21  Yes [provider]  ?fluticasone (FLONASE) 50 MCG/ACT nasal spray Place 1 spray into both nostrils daily as needed for allergies or rhinitis.   Yes [provider]  ?methocarbamol (ROBAXIN) 500 MG tablet Take 1 tablet (500 mg total) by mouth 2 (two) times daily as needed. 10/28/21   Cristie Hem, PA-C  ?Multiple Vitamin (MULTIVITAMIN WITH MINERALS) TABS tablet Take 1 tablet by mouth daily.   Yes [provider]  ?ondansetron (ZOFRAN) 4 MG tablet Take 1 tablet (4 mg total) by mouth every 8 (eight) hours as needed for nausea or vomiting. 10/28/21   Cristie Hem, PA-C  ?oxyCODONE-acetaminophen (PERCOCET) 5-325 MG tablet Take 1-2 tablets by mouth every 6 (six) hours as needed. To  be taken after surgery 10/28/21   Cristie Hem, PA-C  ?simvastatin (ZOCOR) 40 MG tablet Take 40 mg by mouth at bedtime. 08/21/21  Yes [provider]  ? ? ? ?Positive ROS: All other systems have been reviewed and were otherwise negative with the exception of those mentioned in the HPI and as above. ? ?Physical Exam: ?General: Alert, no acute distress ?Cardiovascular: No pedal edema ?Respiratory: No cyanosis, no use of accessory musculature ?GI: abdomen soft ?Skin: No lesions in the area of chief complaint ?Neurologic: Sensation intact distally ?Psychiatric: Patient is competent for consent with normal mood and affect ?Lymphatic: no lymphedema ? ?MUSCULOSKELETAL: exam stable ? ?Assessment: ?LEFT KNEE  DEGENERATIVE JOINT DISEASE ? ?Plan: ?Plan for Procedure(s): ?LEFT TOTAL KNEE ARTHROPLASTY ? ?The risks benefits and alternatives were discussed with the patient including but not limited to the risks of nonoperative treatment, versus surgical intervention including infection, bleeding, nerve injury,  blood clots, cardiopulmonary complications, morbidity, mortality, among others, and they were willing to proceed.  ? ?Preoperative templating of the joint replacement has been completed, documented, and submitted to the Operating Room personnel in order to optimize intra-operative equipment management. ? ? ?Glee Arvin, MD ?11/04/2021 ?9:22 AM ? ?

## 2021-11-04 NOTE — Anesthesia Procedure Notes (Signed)
Anesthesia Regional Block: Adductor canal block  ? ?Pre-Anesthetic Checklist: , timeout performed,  Correct Patient, Correct Site, Correct Laterality,  Correct Procedure, Correct Position, site marked,  Risks and benefits discussed,  Surgical consent,  Pre-op evaluation,  At surgeon's request and post-op pain management ? ?Laterality: Left ? ?Prep: chloraprep     ?  ?Needles:  ?Injection technique: Single-shot ? ?Needle Type: Echogenic Needle   ? ? ?Needle Length: 9cm  ? ? ? ? ?Additional Needles: ? ? ?Procedures:,,,, ultrasound used (permanent image in chart),,    ?Narrative:  ?Start time: 11/04/2021 10:43 AM ?End time: 11/04/2021 10:49 AM ?Injection made incrementally with aspirations every 5 mL. ? ?Performed by: Personally  ?Anesthesiologist: Eilene Ghazi, MD ? ?Additional Notes: ?Patient tolerated the procedure well without complications ? ? ? ?

## 2021-11-04 NOTE — Op Note (Signed)
? ?Total Knee Arthroplasty Procedure Note ? ?Preoperative diagnosis: Left knee osteoarthritis ? ?Postoperative diagnosis:same ? ?Operative procedure: Left total knee arthroplasty. CPT [phone] ? ?Surgeon: N. Glee Arvin, MD ? ?Assist: Oneal Grout, PA-C; necessary for the timely completion of procedure and due to complexity of procedure. ? ?Anesthesia: Spinal, regional ? ?Tourniquet time: 47 mins ? ?Implants used: Zimmer persona pressfit ?Femur: CR 10 ?Tibia: F ?Patella: 32 mm ?Polyethylene: 14 mm, MC ? ?Indication: ?David Livingston is a 66 y.o. year old male with a history of knee pain. Having failed conservative management, the patient elected to proceed with a total knee arthroplasty.  We have reviewed the risk and benefits of the surgery and they elected to proceed after voicing understanding. ? ?Procedure: ? ?After informed consent was obtained and understanding of the risk were voiced including but not limited to bleeding, infection, damage to surrounding structures including nerves and vessels, blood clots, leg length inequality and the failure to achieve desired results, the operative extremity was marked with verbal confirmation of the patient in the holding area.   ?The patient was then brought to the operating room and transported to the operating room table in the supine position.  A tourniquet was applied to the operative extremity around the upper thigh. The operative limb was then prepped and draped in the usual sterile fashion and preoperative antibiotics were administered.  A time out was performed prior to the start of surgery confirming the correct extremity, preoperative antibiotic administration, as well as team members, implants and instruments available for the case. Correct surgical site was also confirmed with preoperative radiographs. ?The limb was then elevated for exsanguination and the tourniquet was inflated. A midline incision was made and a standard medial parapatellar approach was  performed.  The patella was prepared and sized to a 32 mm.  A cover was placed on the patella for protection from retractors.  We then turned our attention to the femur. Posterior cruciate ligament was sacrificed. Start site was drilled in the femur and the intramedullary distal femoral cutting guide was placed, set at 5 degrees valgus, taking 10 mm of distal resection. The distal cut was made. Osteophytes were then removed.   ?Next, the proximal tibial cutting guide was placed with appropriate slope, varus/valgus alignment and depth of resection. The proximal tibial cut was made taking 6 mm off the low side in order to get below the sclerotic bone. Gap blocks were then used to assess the extension gap and alignment, and appropriate soft tissue releases were performed. ?Attention was turned back to the femur, which was sized using the sizing guide to a size 10. Appropriate rotation of the femoral component was determined using epicondylar axis, Whiteside?s line, and assessing the flexion gap under ligament tension. The appropriate size 4-in-1 cutting block was placed and cuts were made.  Posterior femoral osteophytes and uncapped bone were then removed with the curved osteotome.  Trial components were placed, and stability was checked in full extension, mid-flexion, and deep flexion. Proper tibial rotation was determined and marked.  The patella tracked well without a lateral release. ?Trial components were then removed and tibial preparation performed.  The tibia was sized for a size F component.  ?The bony surfaces were irrigated with a pulse lavage and then dried. The stability of the construct was re-evaluated throughout a range of motion and found to be acceptable. The trial liner was removed, the knee was copiously irrigated, and the knee was re-evaluated for any excess bone debris.  The real polyethylene liner, 14 mm thick, was inserted and checked to ensure the locking mechanism had engaged appropriately. The  tourniquet was deflated and hemostasis was achieved. The wound was irrigated with normal saline.  One gram of vancomycin powder was placed in the surgical bed.  Topical 0.25% bupivacaine and meloxicam was placed in the joint for postoperative pain.  Capsular closure was performed with a #1 vicryl, subcutaneous fat closed with a 0 vicryl suture, then subcutaneous tissue closed with interrupted 2.0 vicryl suture. The skin was then closed with a 2.0 nylon and dermabond. A sterile dressing was applied.  The patient was awakened in the operating room and taken to recovery in stable condition. All sponge, needle, and instrument counts were correct at the end of the case. ? ?David Livingston was necessary for opening, closing, retracting, limb positioning and overall facilitation and completion of the surgery. ? ?Position: supine  ?Complications: none.  ?Time Out: performed  ? ?Drains/Packing: none ? ?Estimated blood loss: minimal ? ?Returned to Recovery Room: in good condition.  ? ?Antibiotics: yes  ? ?Mechanical VTE (DVT) Prophylaxis: sequential compression devices, TED thigh-high  ?Chemical VTE (DVT) Prophylaxis: aspirin ? ?Fluid Replacement  ?Crystalloid: see anesthesia record ?Blood: none  ?FFP: none  ? ?Specimens Removed: 1 to pathology  ? ?Sponge and Instrument Count Correct? yes  ? ?PACU: portable radiograph - knee AP and Lateral  ? ?Plan/RTC: Return in 2 weeks for wound check.  ? ?Weight Bearing/Load Lower Extremity: full  ? ?Implant Name Type Inv. Item Serial No. Manufacturer Lot No. LRB No. Used Action  ?INSERT ASF POLY 14 LT - [id number] Insert INSERT ASF POLY 14 LT  ZIMMER [employer]ON(ORTH,TRAU,BIO,SG) [id number] Left 1 Implanted  ?COMP TIB KNEE PS 0D LT - [id number] Joint COMP TIB KNEE PS 0D LT  ZIMMER [employer](ORTH,TRAU,BIO,SG) [id number] Left 1 Implanted  ?IMPL PATELLA METAL [id number] - [id number] Joint IMPL PATELLA METAL [id number]  [employer]MER [employer]ON(ORTH,TRAU,BIO,SG) [id number] Left 1 Implanted  ?COMP FEM PS KNEE STD 10 LT -  [id number] Knees COMP FEM PS KNEE STD 10 LT  ZIMMER [employer]ON(ORTH,TRAU,BIO,SG) [id number] Left 1 Implanted  ? ? ?N. Glee Arvin, MD ?David Livingston ?12:39 PM ? ? ?

## 2021-11-04 NOTE — Transfer of Care (Signed)
Immediate Anesthesia Transfer of Care Note ? ?Patient: David Livingston ? ?Procedure(s) Performed: LEFT TOTAL KNEE ARTHROPLASTY (Left: Knee) ? ?Patient Location: PACU ? ?Anesthesia Type:MAC combined with regional for post-op pain ? ?Level of Consciousness: awake, alert  and patient cooperative ? ?Airway & Oxygen Therapy: Patient Spontanous Breathing and Patient connected to face mask oxygen ? ?Post-op Assessment: Report given to RN and Post -op Vital signs reviewed and stable ? ?Post vital signs: Reviewed and stable ? ?Last Vitals:  ?Vitals Value Taken Time  ?BP 131/78   ?Temp    ?Pulse 66 11/04/21 1324  ?Resp 14 11/04/21 1324  ?SpO2 97 % 11/04/21 1324  ?Vitals shown include unvalidated device data. ? ?Last Pain:  ?Vitals:  ? 11/04/21 0844  ?TempSrc: Oral  ?   ? ?  ? ?Complications: No notable events documented. ?

## 2021-11-04 NOTE — Addendum Note (Signed)
Addendum  created 11/04/21 1541 by Eilene Ghazi, MD  ? Order list changed, Pharmacy for encounter modified  ?  ?

## 2021-11-05 ENCOUNTER — Encounter (HOSPITAL_COMMUNITY): Payer: Self-pay | Admitting: Orthopaedic Surgery

## 2021-11-05 DIAGNOSIS — M1712 Unilateral primary osteoarthritis, left knee: Secondary | ICD-10-CM | POA: Diagnosis not present

## 2021-11-05 LAB — CBC
HCT: 39.7 % (ref 39.0–52.0)
Hemoglobin: 13.8 g/dL (ref 13.0–17.0)
MCH: 31.4 pg (ref 26.0–34.0)
MCHC: 34.8 g/dL (ref 30.0–36.0)
MCV: 90.4 fL (ref 80.0–100.0)
Platelets: 225 10*3/uL (ref 150–400)
RBC: 4.39 MIL/uL (ref 4.22–5.81)
RDW: 12.7 % (ref 11.5–15.5)
WBC: 14.2 10*3/uL — ABNORMAL HIGH (ref 4.0–10.5)
nRBC: 0 % (ref 0.0–0.2)

## 2021-11-05 LAB — BASIC METABOLIC PANEL
Anion gap: 7 (ref 5–15)
BUN: 15 mg/dL (ref 8–23)
CO2: 27 mmol/L (ref 22–32)
Calcium: 8.8 mg/dL — ABNORMAL LOW (ref 8.9–10.3)
Chloride: 103 mmol/L (ref 98–111)
Creatinine, Ser: 0.95 mg/dL (ref 0.61–1.24)
GFR, Estimated: >60 mL/min (ref >60)
Glucose, Bld: 149 mg/dL — ABNORMAL HIGH (ref 70–99)
Potassium: 4.1 mmol/L (ref 3.5–5.1)
Sodium: 137 mmol/L (ref 135–145)

## 2021-11-05 NOTE — Progress Notes (Signed)
Subjective: ?1 Day Post-Op Procedure(s) (LRB): ?LEFT TOTAL KNEE ARTHROPLASTY (Left) ?Patient reports pain as mild.   ? ?Objective: ?Vital signs in last 24 hours: ?Temp:  [97.4 ?F (36.3 ?C)-99.1 ?F (37.3 ?C)] 97.7 ?F (36.5 ?C) (03/28 0746) ?Pulse Rate:  [51-89] 89 (03/28 0746) ?Resp:  [11-20] 17 (03/28 0746) ?BP: (118-195)/(71-104) 118/71 (03/28 0746) ?SpO2:  [94 %-100 %] 94 % (03/28 0746) ?Weight:  [96.2 kg] 96.2 kg (03/27 0844) ? ?Intake/Output from previous day: ?03/27 0701 - 03/28 0700 ?In: 1440 [P.O.:240; I.V.:1000; IV Piggyback:200] ?Out: 1150 [Urine:1100; Blood:50] ?Intake/Output this shift: ?No intake/output data recorded. ? ?Recent Labs  ?  11/04/21 ?1137 11/05/21 ?0627  ?HGB 13.8 13.8  ? ?Recent Labs  ?  11/04/21 ?1137 11/05/21 ?0627  ?WBC 9.8 14.2*  ?RBC 4.42 4.39  ?HCT 40.0 39.7  ?PLT 269 225  ? ?Recent Labs  ?  11/05/21 ?0627  ?NA 137  ?K 4.1  ?CL 103  ?CO2 27  ?BUN 15  ?CREATININE 0.95  ?GLUCOSE 149*  ?CALCIUM 8.8*  ? ?No results for input(s): LABPT, INR in the last 72 hours. ? ?Neurologically intact ?Neurovascular intact ?Sensation intact distally ?Intact pulses distally ?Dorsiflexion/Plantar flexion intact ?Incision: dressing C/D/I ?No cellulitis present ?Compartment soft ? ? ?Assessment/Plan: ?1 Day Post-Op Procedure(s) (LRB): ?LEFT TOTAL KNEE ARTHROPLASTY (Left) ?Advance diet ?Up with therapy ?D/C IV fluids ?Discharge home with home health after second PT session ?WBAT LLE ? ? ? ?Anticipated LOS equal to or greater than 2 midnights due to ?- Age 29 and older with one or more of the following: ? - Obesity ? - Expected need for hospital services (PT, OT, Nursing) required for safe  discharge ? - Anticipated need for postoperative skilled nursing care or inpatient rehab ? -  ?OR  ? ?- Unanticipated findings during/Post Surgery: None  ?- Patient is a high risk of re-admission due to: None ? ? ?Cristie Hem ?11/05/2021, 8:10 AM ? ?

## 2021-11-05 NOTE — Progress Notes (Signed)
Physical Therapy Treatment and Discharge ?Patient Details ?Name: David Livingston ?MRN: [id number] ?DOB: 05-Apr-1956 ?Today's Date: 11/05/2021 ? ? ?History of Present Illness Pt adm 3/27 for lt TKR. PMH - CAD, HTN, sleep apnea, partial rt knee replacement ? ?  ?PT Comments  ? ? Pt made good progress towards his physical therapy goals. Session focused on reviewing HEP for right knee strengthening/ROM, gait training, stair training, car transfer technique, and cryotherapy/elevation. Pt ambulating 200 ft with a walker utilizing a step to vs step through pattern. Negotiated 3 steps with rails to simulate home set up. Achieving 82 degrees seated knee flexion. Will benefit from follow up PT to address deficits. No further acute PT needs. ?  ?Recommendations for follow up therapy are one component of a multi-disciplinary discharge planning process, led by the attending physician.  Recommendations may be updated based on patient status, additional functional criteria and insurance authorization. ? ?Follow Up Recommendations ? Follow physician's recommendations for discharge plan and follow up therapies ?  ?  ?Assistance Recommended at Discharge Intermittent Supervision/Assistance  ?Patient can return home with the following Help with stairs or ramp for entrance ?  ?Equipment Recommendations ? None recommended by PT  ?  ?Recommendations for Other Services   ? ? ?  ?Precautions / Restrictions Precautions ?Precautions: Knee ?Restrictions ?Weight Bearing Restrictions: No  ?  ? ?Mobility ? Bed Mobility ?Overal bed mobility: Needs Assistance ?Bed Mobility: Sit to Supine ?  ?  ?  ?Sit to supine: Min assist ?  ?General bed mobility comments: MinA for LLE elevation back into bed ?  ? ?Transfers ?Overall transfer level: Modified independent ?Equipment used: Rolling walker (2 wheels) ?  ?  ?  ?  ?  ?  ?  ?  ?  ? ?Ambulation/Gait ?Ambulation/Gait assistance: Modified independent (Device/Increase time) ?Gait Distance (Feet): 200 Feet ?Assistive  device: Rolling walker (2 wheels) ?Gait Pattern/deviations: Step-through pattern, Decreased stride length, Decreased stance time - left, Step-to pattern, Decreased dorsiflexion - left ?Gait velocity: decr ?  ?  ?General Gait Details: assist for safety ? ? ?Stairs ?Stairs: Yes ?Stairs assistance: Supervision ?Stair Management: Two rails ?Number of Stairs: 3 ?General stair comments: cues for sequencing/technique ? ? ?Wheelchair Mobility ?  ? ?Modified Rankin (Stroke Patients Only) ?  ? ? ?  ?Balance Overall balance assessment: No apparent balance deficits (not formally assessed) ?  ?  ?  ?  ?  ?  ?  ?  ?  ?  ?  ?  ?  ?  ?  ?  ?  ?  ?  ? ?  ?Cognition Arousal/Alertness: Awake/alert ?Behavior During Therapy: [facility] for tasks assessed/performed ?Overall Cognitive Status: Within Functional Limits for tasks assessed ?  ?  ?  ?  ?  ?  ?  ?  ?  ?  ?  ?  ?  ?  ?  ?  ?  ?  ?  ? ?  ?Exercises Total Joint Exercises ?Quad Sets: AROM, Left, 10 reps, Supine ?Heel Slides: AAROM, Left, 10 reps, Supine ?Hip ABduction/ADduction: AAROM, Left, 10 reps, Supine ?Straight Leg Raises: AAROM, Left, 10 reps, Supine ?Long Arc Quad: AAROM, Left, 10 reps, Seated ?Knee Flexion: Left, Seated, AAROM, 10 reps ?Goniometric ROM: 82 degrees seated knee flexion ? ?  ?General Comments   ?  ?  ? ?Pertinent Vitals/Pain Pain Assessment ?Pain Assessment: Faces ?Faces Pain Scale: Hurts little more ?Pain Location: lt knee ?Pain Descriptors / Indicators: Sore ?Pain Intervention(s): Monitored during session  ? ? ?  Home Living   ?  ?  ?  ?  ?  ?  ?  ?  ?  ?   ?  ?Prior Function    ?  ?  ?   ? ?PT Goals (current goals can now be found in the care plan section) Acute Rehab PT Goals ?Patient Stated Goal: return home ?PT Goal Formulation: With patient ?Time For Goal Achievement: 11/08/21 ?Potential to Achieve Goals: Good ?Progress towards PT goals: Progressing toward goals ? ?  ?Frequency ? ? ? 7X/week ? ? ? ?  ?PT Plan Other (comment) (d/c therapy)  ? ? ?Co-evaluation    ?  ?  ?  ?  ? ?  ?AM-PAC PT "6 Clicks" Mobility   ?Outcome Measure ? Help needed turning from your back to your side while in a flat bed without using bedrails?: None ?Help needed moving from lying on your back to sitting on the side of a flat bed without using bedrails?: None ?Help needed moving to and from a bed to a chair (including a wheelchair)?: None ?Help needed standing up from a chair using your arms (e.g., wheelchair or bedside chair)?: None ?Help needed to walk in hospital room?: None ?Help needed climbing 3-5 steps with a railing? : A Little ?6 Click Score: 23 ? ?  ?End of Session   ?Activity Tolerance: Patient tolerated treatment well ?Patient left: with call bell/phone within reach;in bed ?Nurse Communication: Mobility status ?PT Visit Diagnosis: Other abnormalities of gait and mobility (R26.89);Pain ?Pain - Right/Left: Left ?Pain - part of body: Knee ?  ? ? ?Time: 4967-5916 ?PT Time Calculation (min) (ACUTE ONLY): 32 min ? ?Charges:  $Gait Training: 8-22 mins ?$Therapeutic Exercise: 8-22 mins          ?          ? ?David Livingston, PT, DPT ?Acute Rehabilitation Services ?Pager [phone] ?Office [phone] ? ? ? ?David Livingston ?11/05/2021, 9:19 AM ? ?

## 2021-11-05 NOTE — Discharge Summary (Signed)
Patient ID: ?Dayna Ramus ?MRN: [id number] ?DOB/AGE: 66-Mar-1957 66 y.o. ? ?Admit date: 11/04/2021 ?Discharge date: 11/05/2021 ? ?Admission Diagnoses:  ?Principal Problem: ?  Primary osteoarthritis of left knee ?Active Problems: ?  Status post total left knee replacement ? ? ?Discharge Diagnoses:  ?Same ? ?Past Medical History:  ?Diagnosis Date  ? Anginal pain (HCC)   ? Arthritis   ? Coronary artery disease   ? Hepatitis   ? Hypertension   ? Sleep apnea   ? ? ?Surgeries: Procedure(s): ?LEFT TOTAL KNEE ARTHROPLASTY on 11/04/2021 ?  ?Consultants:  ? ?Discharged Condition: Improved ? ?Hospital Course: Creg Gilmer is an 66 y.o. male who was admitted 11/04/2021 for operative treatment ofPrimary osteoarthritis of left knee. Patient has severe unremitting pain that affects sleep, daily activities, and work/hobbies. After pre-op clearance the patient was taken to the operating room on 11/04/2021 and underwent  Procedure(s): ?LEFT TOTAL KNEE ARTHROPLASTY.   ? ?Patient was given perioperative antibiotics:  ?Anti-infectives (From admission, onward)  ? ? Start     Dose/Rate Route Frequency Ordered Stop  ? 11/04/21 1800  ceFAZolin (ANCEF) IVPB 2g/100 mL premix       ? 2 g ?200 mL/hr over 30 Minutes Intravenous Every 6 hours 11/04/21 1438 11/04/21 2344  ? 11/04/21 1203  vancomycin (VANCOCIN) powder  Status:  Discontinued       ?   As needed 11/04/21 1204 11/04/21 1319  ? 11/04/21 0830  ceFAZolin (ANCEF) IVPB 2g/100 mL premix       ? 2 g ?200 mL/hr over 30 Minutes Intravenous On call to O.R. 11/04/21 0829 11/04/21 1803  ? ?  ?  ? ?Patient was given sequential compression devices, early ambulation, and chemoprophylaxis to prevent DVT. ? ?Patient benefited maximally from hospital stay and there were no complications.   ? ?Recent vital signs: Patient Vitals for the past 24 hrs: ? BP Temp Temp src Pulse Resp SpO2 Height Weight  ?11/05/21 0746 118/71 97.7 ?F (36.5 ?C) Oral 89 17 94 % -- --  ?11/05/21 0327 138/76 98.5 ?F (36.9 ?C) Oral 75 20 97  % -- --  ?11/04/21 2317 (!) 175/96 99.1 ?F (37.3 ?C) Oral 68 20 97 % -- --  ?11/04/21 2028 (!) 164/88 98.6 ?F (37 ?C) Oral 71 20 99 % -- --  ?11/04/21 1527 (!) 140/95 98 ?F (36.7 ?C) -- 60 18 100 % -- --  ?11/04/21 1430 (!) 153/93 98.2 ?F (36.8 ?C) -- (!) 55 14 100 % -- --  ?11/04/21 1415 (!) 142/83 -- -- (!) 52 13 100 % -- --  ?11/04/21 1400 (!) 154/85 -- -- (!) 51 13 100 % -- --  ?11/04/21 1341 (!) 147/81 -- -- (!) 56 12 98 % -- --  ?11/04/21 1326 131/78 (!) 97.4 ?F (36.3 ?C) -- 64 16 97 % -- --  ?11/04/21 1045 (!) 185/96 -- -- 63 16 98 % -- --  ?11/04/21 1040 (!) 195/104 -- -- 61 11 99 % -- --  ?11/04/21 0844 (!) 157/78 98.6 ?F (37 ?C) Oral 63 17 100 % 5\' 9"  (1.753 m) 96.2 kg  ?  ? ?Recent laboratory studies:  ?Recent Labs  ?  11/04/21 ?1137 11/05/21 ?0627  ?WBC 9.8 14.2*  ?HGB 13.8 13.8  ?HCT 40.0 39.7  ?PLT 269 225  ?NA  --  137  ?K  --  4.1  ?CL  --  103  ?CO2  --  27  ?BUN  --  15  ?CREATININE  --  0.95  ?  GLUCOSE  --  149*  ?CALCIUM  --  8.8*  ? ? ? ?Discharge Medications:   ?Allergies as of 11/05/2021   ?No Known Allergies ?  ? ?  ?Medication List  ?  ? ?TAKE these medications   ? ?amLODipine 5 MG tablet ?Commonly known as: NORVASC ?Take 5 mg by mouth daily. ?  ?aspirin EC 81 MG tablet ?Take 1 tablet (81 mg total) by mouth 2 (two) times daily. To be taken after surgery to prevent blood clots ?  ?benazepril 20 MG tablet ?Commonly known as: LOTENSIN ?Take 20 mg by mouth daily. ?  ?cholecalciferol 25 MCG (1000 UNIT) tablet ?Commonly known as: VITAMIN D3 ?Take 1,000 Units by mouth daily. ?  ?docusate sodium 100 MG capsule ?Commonly known as: Colace ?Take 1 capsule (100 mg total) by mouth daily as needed. ?  ?ezetimibe 10 MG tablet ?Commonly known as: ZETIA ?Take 10 mg by mouth daily. ?  ?fluticasone 50 MCG/ACT nasal spray ?Commonly known as: FLONASE ?Place 1 spray into both nostrils daily as needed for allergies or rhinitis. ?  ?methocarbamol 500 MG tablet ?Commonly known as: Robaxin ?Take 1 tablet (500 mg total)  by mouth 2 (two) times daily as needed. ?  ?multivitamin with minerals Tabs tablet ?Take 1 tablet by mouth daily. ?  ?ondansetron 4 MG tablet ?Commonly known as: Zofran ?Take 1 tablet (4 mg total) by mouth every 8 (eight) hours as needed for nausea or vomiting. ?  ?oxyCODONE-acetaminophen 5-325 MG tablet ?Commonly known as: Percocet ?Take 1-2 tablets by mouth every 6 (six) hours as needed. To be taken after surgery ?  ?simvastatin 40 MG tablet ?Commonly known as: ZOCOR ?Take 40 mg by mouth at bedtime. ?  ? ?  ? ?  ?  ? ? ?  ?Durable Medical Equipment  ?(From admission, onward)  ?  ? ? ?  ? ?  Start     Ordered  ? 11/04/21 1317  DME Walker rolling  Once       ?Question Answer Comment  ?Walker: With 5 Inch Wheels   ?Patient needs a walker to treat with the following condition Status post left partial knee replacement   ?  ? 11/04/21 1316  ? 11/04/21 1317  DME 3 n 1  Once       ? 11/04/21 1316  ? 11/04/21 1317  DME Bedside commode  Once       ?Question:  Patient needs a bedside commode to treat with the following condition  Answer:  Status post left partial knee replacement  ? 11/04/21 1316  ? ?  ?  ? ?  ? ? ?Diagnostic Studies: DG Knee Left Port ? ?Result Date: 11/04/2021 ?CLINICAL DATA:  Postop near put EXAM: PORTABLE LEFT KNEE - 1-2 VIEW COMPARISON:  Radiograph 08/30/2021 FINDINGS: Total knee arthroplasty is in normal alignment out evidence of loosening or periprosthetic fracture. Expected soft tissue changes. IMPRESSION: Left knee arthroplasty without evidence of immediate hardware complication. Electronically Signed   By: Caprice Renshaw M.D.   On: 11/04/2021 13:48   ? ?Disposition: Discharge disposition: 01-Home or Self Care ? ? ? ? ? ? ? ? ? Follow-up Information   ? ? Tarry Kos, MD. Schedule an appointment as soon as possible for a visit in 2 week(s).   ?Specialty: Orthopedic Surgery ?Contact information: ?[address] ?[place] [address] ?[phone] ? ? ?  ?  ? ? [facility] Follow up.    ?Specialty: Home Health Services ?Contact  information: ?[address] ?[address] ?[place] [address] ?[phone] ? ? ?  ?  ? ?  ?  ? ?  ? ? ? ?Signed: ?Cristie HemMary L Novah Nessel ?11/05/2021, 8:12 AM ? ? ? ? ?

## 2021-11-05 NOTE — Evaluation (Signed)
Occupational Therapy Evaluation ?Patient Details ?Name: David Livingston ?MRN: [id number] ?DOB: July 24, 1956 ?Today's Date: 11/05/2021 ? ? ?History of Present Illness Pt adm 3/27 for lt TKR. PMH - CAD, HTN, sleep apnea, partial rt knee replacement  ? ?Clinical Impression ?  ?Patient evaluated by Occupational Therapy with no further acute OT needs identified. All education has been completed and the patient has no further questions. See below for any follow-up Occupational Therapy or equipment needs. OT to sign off. Thank you for referral.  ?  ?   ? ?Recommendations for follow up therapy are one component of a multi-disciplinary discharge planning process, led by the attending physician.  Recommendations may be updated based on patient status, additional functional criteria and insurance authorization.  ? ?Follow Up Recommendations ? No OT follow up  ?  ?Assistance Recommended at Discharge PRN  ?Patient can return home with the following Assist for transportation ? ?  ?Functional Status Assessment ? Patient has had a recent decline in their functional status and demonstrates the ability to make significant improvements in function in a reasonable and predictable amount of time.  ?Equipment Recommendations ?    ?  ?Recommendations for Other Services   ? ? ?  ?Precautions / Restrictions Precautions ?Precautions: Knee ?Restrictions ?Weight Bearing Restrictions: No  ? ?  ? ?Mobility Bed Mobility ?  ?  ?  ?  ?  ?  ?  ?General bed mobility comments: oob on arrival ?  ? ?Transfers ?Overall transfer level: Modified independent ?Equipment used: Rolling walker (2 wheels) ?  ?  ?  ?  ?  ?  ?  ?  ?  ? ?  ?Balance   ?  ?  ?  ?  ?  ?  ?  ?  ?  ?  ?  ?  ?  ?  ?  ?  ?  ?  ?   ? ?ADL either performed or assessed with clinical judgement  ? ?ADL Overall ADL's : Needs assistance/impaired ?Eating/Feeding: Independent ?  ?Grooming: Therapist, nutritional;Supervision/safety;Standing ?  ?  ?  ?  ?  ?Upper Body Dressing : Supervision/safety;Standing ?  ?Lower  Body Dressing: Min guard;Sit to/from stand;With adaptive equipment ?Lower Body Dressing Details (indicate cue type and reason): educated on AE. pt plans to purchase on [place] ?Toilet Transfer: Supervision/safety;Rolling walker (2 wheels) ?  ?  ?  ?  ?  ?Functional mobility during ADLs: Supervision/safety;Rolling walker (2 wheels) ?   ?Marland Kitchen Pt educated on: clothing between ice machine and skin and off after 30 minutes,set an alarm at night for medication, and never wash directly over incision (clean line at all times). All education is complete and patient indicates understanding. ? ? ? ?Vision Baseline Vision/History: 1 Wears glasses ?Ability to See in Adequate Light: 0 Adequate ?   ?   ?Perception   ?  ?Praxis   ?  ? ?Pertinent Vitals/Pain Pain Assessment ?Pain Assessment: Faces ?Pain Score: 2  ?Pain Location: L knee ?Pain Descriptors / Indicators: Sore ?Pain Intervention(s): Monitored during session, Premedicated before session, Repositioned  ? ? ? ?Hand Dominance Right ?  ?Extremity/Trunk Assessment Upper Extremity Assessment ?Upper Extremity Assessment: Overall WFL for tasks assessed ?  ?Lower Extremity Assessment ?Lower Extremity Assessment: Defer to PT evaluation ?  ?Cervical / Trunk Assessment ?Cervical / Trunk Assessment: Normal ?  ?Communication Communication ?Communication: No difficulties ?  ?Cognition Arousal/Alertness: Awake/alert ?Behavior During Therapy: [facility] for tasks assessed/performed ?Overall Cognitive Status: Within Functional Limits for tasks assessed ?  ?  ?  ?  ?  ?  ?  ?  ?  ?  ?  ?  ?  ?  ?  ?  ?  ?  ?  ?  General Comments  incision is dry and dressing is intact. Pt progressed with dressing this session ? ?  ?Exercises   ?  ?Shoulder Instructions    ? ? ?Home Living Family/patient expects to be discharged to:: Private residence ?Living Arrangements: Spouse/significant other ?Available Help at Discharge: Family;Available 24 hours/day ?Type of Home: House ?Home Access: Stairs to enter ?Entrance  Stairs-Number of Steps: 4-5 ?Entrance Stairs-Rails: Right;Left;Can reach both ?Home Layout: One level ?  ?  ?Bathroom Shower/Tub: Walk-in shower ?  ?Bathroom Toilet: Standard ?  ?  ?Home Equipment: Agricultural consultant (2 wheels) ?  ?Additional Comments: will have wife at home 24/7 as she works from home. 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  ? ?  ?Prior Functioning/Environment Prior Level of Function : Independent/Modified Independent;Working/employed;Driving ?  ?  ?  ?  ?  ?  ?Mobility Comments: No assistive device ?  ?  ? ?  ?  ?OT Problem List:   ?  ?   ?OT Treatment/Interventions:    ?  ?OT Goals(Current goals can be found in the care plan section) Acute Rehab OT Goals ?Patient Stated Goal: to go home today. wife is o n the way ?OT Goal Formulation: With patient ?Potential to Achieve Goals: Good  ?OT Frequency:   ?  ? ?Co-evaluation   ?  ?  ?  ?  ? ?  ?AM-PAC OT "6 Clicks" Daily Activity     ?Outcome Measure Help from another person eating meals?: None ?Help from another person taking care of personal grooming?: None ?Help from another person toileting, which includes using toliet, bedpan, or urinal?: None ?Help from another person bathing (including washing, rinsing, drying)?: None ?Help from another person to put on and taking off regular upper body clothing?: None ?Help from another person to put on and taking off regular lower body clothing?: None ?6 Click Score: 24 ?  ?End of Session Equipment Utilized During Treatment: Rolling walker (2 wheels) ?Nurse Communication: Mobility status;Precautions ? ?Activity Tolerance: Patient tolerated treatment well ?Patient left: in chair;with call bell/phone within reach ? ?OT Visit Diagnosis: Unsteadiness on feet (R26.81)  ?              ?Time: 6967-8938 ?OT Time Calculation (min): 22 min ?Charges:  OT General Charges ?$OT Visit: 1 Visit ?OT Evaluation ?$OT Eval Moderate Complexity: 1 Mod ? ? ?Brynn, OTR/L  ?Acute Rehabilitation Services ?Pager:  [phone] ?Office: [phone] ?. ? ? ?Mateo Flow ?11/05/2021, 11:29 AM ?

## 2021-11-05 NOTE — Progress Notes (Signed)
Patient alert and oriented, voiding adequately, skin clean, dry and intact without evidence of skin break down, or symptoms of complications - no redness or edema noted, only slight tenderness at site.  Patient states pain is manageable at time of discharge. Patient has an appointment with MD in 2 weeks 

## 2021-11-12 ENCOUNTER — Encounter (HOSPITAL_COMMUNITY): Payer: Self-pay | Admitting: Orthopaedic Surgery

## 2021-11-18 ENCOUNTER — Other Ambulatory Visit: Payer: Self-pay | Admitting: Physician Assistant

## 2021-11-18 ENCOUNTER — Telehealth: Payer: Self-pay | Admitting: Orthopaedic Surgery

## 2021-11-18 MED ORDER — METHOCARBAMOL 500 MG PO TABS: 500.0000 mg | ORAL_TABLET | Freq: Two times a day (BID) | ORAL | 2 refills | Status: DC | PRN

## 2021-11-18 MED ORDER — OXYCODONE-ACETAMINOPHEN 5-325 MG PO TABS: 1.0000 | ORAL_TABLET | Freq: Four times a day (QID) | ORAL | 0 refills | Status: AC | PRN

## 2021-11-18 NOTE — Telephone Encounter (Signed)
I called patient and advised. 

## 2021-11-18 NOTE — Telephone Encounter (Signed)
Pt calling stating after 10am this morning he will be without pain med. Pt asking for a refill for robaxin and oxycodone. The best pharmacy is [facility] [phone] - [place], [place]. Pt asked for a call when it has been sent in. The best call back number is [phone].  ?

## 2021-11-18 NOTE — Telephone Encounter (Signed)
sent 

## 2021-11-19 ENCOUNTER — Encounter: Payer: Self-pay | Admitting: Physician Assistant

## 2021-11-19 ENCOUNTER — Ambulatory Visit (INDEPENDENT_AMBULATORY_CARE_PROVIDER_SITE_OTHER): Payer: BC Managed Care – PPO | Admitting: Physician Assistant

## 2021-11-19 DIAGNOSIS — Z96652 Presence of left artificial knee joint: Secondary | ICD-10-CM

## 2021-11-19 NOTE — Progress Notes (Signed)
? ?  Post-Op Visit Note ?  ?Patient: David Livingston           ?Date of Birth: 1955/09/20           ?MRN: [id number] ?Visit Date: 11/19/2021 ?PCP: Jeralyn Bennett, MD ? ? ?Assessment & Plan: ? ?Chief Complaint:  ?Chief Complaint  ?Patient presents with  ? Left Knee - Pain, Routine Post Op  ? ?Visit Diagnoses:  ?1. Hx of total knee replacement, left   ? ? ?Plan: Patient is a pleasant 66 year old gentleman who comes in today 2 weeks status post left total knee replacement 11/04/2021.  He has been doing relatively well but has been experiencing a fair amount of swelling to the left knee.  He has been using ice and taking Percocet.  Has been compliant wearing his compression socks but thinks that they are too tight.  He has been getting home health physical therapy and is ambulating with a walker.  He has been compliant taking his aspirin twice daily for DVT prophylaxis.  Examination of left knee reveals moderate swelling.  Fully healed surgical scar with nylon sutures in place.  Calf soft nontender.  He is neurovascular intact distally.  Today, sutures were removed and Steri-Strips applied.  I provided him with a new prescription for TED hose detailed medical supply.  He will continue taking his aspirin twice daily for another 4 weeks.  Formal physical therapy referral has been made.  Dental prophylaxis reinforced.  Follow-up with 4 weeks for repeat evaluation and 2 view x-rays of the left knee.  Call with concerns or questions. ? ?Follow-Up Instructions: Return in about 4 weeks (around 12/17/2021).  ? ?Orders:  ?Orders Placed This Encounter  ?Procedures  ? Ambulatory referral to Physical Therapy  ? ?No orders of the defined types were placed in this encounter. ? ? ?Imaging: ?No new imaging ? ?PMFS History: ?Patient Active Problem List  ? Diagnosis Date Noted  ? Status post total left knee replacement 11/04/2021  ? Primary osteoarthritis of left knee 08/31/2021  ? ?Past Medical History:  ?Diagnosis Date  ? Anginal pain (HCC)    ? Arthritis   ? Coronary artery disease   ? Hepatitis   ? Hypertension   ? Sleep apnea   ?  ?History reviewed. No pertinent family history.  ?Past Surgical History:  ?Procedure Laterality Date  ? CARDIAC CATHETERIZATION    ? ~ 2005 stent to D1; 10/30/10: DES dRCA with 0% residual stenosis & cutting balloon angioplasty D1 for ISR with 70% stenosis reduced to 30% ([facility], [place])  ? JOINT REPLACEMENT    ? partial right knee replacement  ? TONSILLECTOMY    ? TOTAL KNEE ARTHROPLASTY Left 11/04/2021  ? Procedure: LEFT TOTAL KNEE ARTHROPLASTY;  Surgeon: Tarry Kos, MD;  Location: [facility] OR;  Service: Orthopedics;  Laterality: Left;  ? ?Social History  ? ?Occupational History  ? Not on file  ?Tobacco Use  ? Smoking status: Every Day  ?  Packs/day: 0.25  ?  Types: Cigarettes  ? Smokeless tobacco: Never  ?Substance and Sexual Activity  ? Alcohol use: Yes  ?  Alcohol/week: 10.0 standard drinks  ?  Types: 10 Shots of liquor per week  ? Drug use: Never  ? Sexual activity: Not on file  ? ? ? ?

## 2021-11-21 ENCOUNTER — Telehealth: Payer: Self-pay | Admitting: *Deleted

## 2021-11-21 NOTE — Telephone Encounter (Signed)
Call from HHPT asking when patient was scheduled for OPPT to begin. Order has been placed this week. HHPT has discharged. Scheduled for Monday, 11/25/21 at 1:00 pm. Call to patient and he is aware and agreeable. ?

## 2021-11-25 ENCOUNTER — Ambulatory Visit (INDEPENDENT_AMBULATORY_CARE_PROVIDER_SITE_OTHER): Payer: BC Managed Care – PPO | Admitting: Physical Therapy

## 2021-11-25 ENCOUNTER — Encounter: Payer: Self-pay | Admitting: Physical Therapy

## 2021-11-25 DIAGNOSIS — M25662 Stiffness of left knee, not elsewhere classified: Secondary | ICD-10-CM

## 2021-11-25 DIAGNOSIS — M6281 Muscle weakness (generalized): Secondary | ICD-10-CM | POA: Diagnosis not present

## 2021-11-25 DIAGNOSIS — R2689 Other abnormalities of gait and mobility: Secondary | ICD-10-CM

## 2021-11-25 DIAGNOSIS — R600 Localized edema: Secondary | ICD-10-CM

## 2021-11-25 DIAGNOSIS — M25562 Pain in left knee: Secondary | ICD-10-CM | POA: Diagnosis not present

## 2021-11-25 NOTE — Therapy (Signed)
?OUTPATIENT PHYSICAL THERAPY LOWER EXTREMITY EVALUATION ? ? ?Patient Name: David Livingston ?MRN: [id number] ?DOB:1956-06-01, 66 y.o., male ?Today's Date: 11/25/2021 ? ? PT End of Session - 11/25/21 1350   ? ? Visit Number 1   ? Number of Visits 18   ? Date for PT Re-Evaluation 01/20/22   ? PT Start Time 1300   ? PT Stop Time 1344   ? PT Time Calculation (min) 44 min   ? Activity Tolerance Patient tolerated treatment well   ? Behavior During Therapy [facility] for tasks assessed/performed   ? ?  ?  ? ?  ? ? ?Past Medical History:  ?Diagnosis Date  ? Anginal pain (HCC)   ? Arthritis   ? Coronary artery disease   ? Hepatitis   ? Hypertension   ? Sleep apnea   ? ?Past Surgical History:  ?Procedure Laterality Date  ? CARDIAC CATHETERIZATION    ? ~ 2005 stent to D1; 10/30/10: DES dRCA with 0% residual stenosis & cutting balloon angioplasty D1 for ISR with 70% stenosis reduced to 30% ([facility], [place])  ? JOINT REPLACEMENT    ? partial right knee replacement  ? TONSILLECTOMY    ? TOTAL KNEE ARTHROPLASTY Left 11/04/2021  ? Procedure: LEFT TOTAL KNEE ARTHROPLASTY;  Surgeon: Tarry Kos, MD;  Location: [facility] OR;  Service: Orthopedics;  Laterality: Left;  ? ?Patient Active Problem List  ? Diagnosis Date Noted  ? Status post total left knee replacement 11/04/2021  ? Primary osteoarthritis of left knee 08/31/2021  ? ? ?PCP: Jeralyn Bennett, MD ? ?REFERRING PROVIDER: Tarry Kos, MD ? ?REFERRING DIAG: Y77.412 (ICD-10-CM) - Hx of total knee replacement, left ? ?THERAPY DIAG:  ?Acute pain of left knee ? ?Stiffness of left knee, not elsewhere classified ? ?Muscle weakness (generalized) ? ?Other abnormalities of gait and mobility ? ?Localized edema ? ?ONSET DATE: Lt TKA 11/04/21 ? ?SUBJECTIVE:  ? ?SUBJECTIVE STATEMENT: ?He relays Lt TKA 3 weeks ago and is doing well with pain but having trouble with ROM and his knee is so stiff in the morning. ? ?PERTINENT HISTORY: ?Lt TKA 11/04/21, CAD, HTN, sleep apnea, partial rt knee  replacement ? ?PAIN:  ?Are you having pain? Yes: NPRS scale: 1-2/10 ?Pain location: front and sides of knees ?Pain description: discomfort, tight ?Aggravating factors: flexing his knee ?Relieving factors: nothing in paticular ? ?PRECAUTIONS: None ? ?WEIGHT BEARING RESTRICTIONS No ? ?FALLS:  ?Has patient fallen in last 6 months? No ? ?OCCUPATION: sales ? ?PLOF: Independent ? ?PATIENT GOALS reduce pain and get more ROM ? ? ?OBJECTIVE:  ? ?DIAGNOSTIC FINDINGS: Xray in chart ? ?PATIENT SURVEYS:  ?FOTO 51% functional, goal is 66% ? ?COGNITION: ? Overall cognitive status: Within functional limits for tasks assessed   ?  ?SENSATION: light touch intact ? ?MUSCLE LENGTH: ?Tight hamstrings and quads on Left ? ?POSTURE:  ?WFL but decreased weight shift to left ? ?LE ROM: ? ?AROM/PROM Right ?11/25/2021 Left ?11/25/2021  ?Hip flexion    ?Hip extension    ?Hip abduction    ?Hip adduction    ?Hip internal rotation    ?Hip external rotation    ?Knee flexion 124/ 90/100  ?Knee extension 0/ 2/0  ?Ankle dorsiflexion    ?Ankle plantarflexion    ?Ankle inversion    ?Ankle eversion    ? (Blank rows = not tested) ? ?LE MMT: ? ?MMT in sitting Left ?11/25/2021  ?Hip flexion 4+  ?Hip extension   ?Hip abduction 4+  ?Hip  adduction   ?Hip internal rotation   ?Hip external rotation   ?Knee flexion 4  ?Knee extension 4+  ?Ankle dorsiflexion   ?Ankle plantarflexion   ?Ankle inversion   ?Ankle eversion   ? (Blank rows = not tested) ? ? ?FUNCTIONAL TESTS:  ?11/25/21 ?5 times sit to stand: 10 seconds without UE support but decreased weight shift to left ? ?GAIT: ?Distance walked: 50 ?Assistive device utilized: came in with single walking stick but can ambulate short distance without AD ?Level of assistance: Modified independence ?Comments: slower velocity, mild antalgic gait with decreased weight shift to left ? ? ? ?TODAY'S TREATMENT: ?11/25/21 ?Nu step L5 X 8 min seat#6 for max flexion ?Lt leg supine heelslides AAROM 5 sec X 10 ?Lt knee PROM and flexion  mobs to tolerance ?Reviewed HEP listed below ? ? ?PATIENT EDUCATION:  ?Education details: HEP,POC ?Person educated: Patient ?Education method: Explanation, Demonstration, Verbal cues, and Handouts ?Education comprehension: verbalized understanding, returned demonstration, and needs further education ? ? ?HOME EXERCISE PROGRAM: ?Access Code: [email or web address] ?URL: [email or web address] ?Date: 11/25/2021 ?Prepared by: Ivery QualeBrian Oriah Leinweber ? ?Exercises ?- standing knee flexion stretch on chair or step  - 2 x daily - 6 x weekly - 1 sets - 10 reps - 10 hold ?- Supine Heel Slide with Strap  - 2 x daily - 6 x weekly - 1-2 sets - 10 reps - 5 hold ?- Supine Quadriceps Stretch with Strap on Table  - 2 x daily - 6 x weekly - 2-3 reps - 30 hold ?- Seated Straight Leg Raise with Quad Contraction  - 2 x daily - 6 x weekly - 2-3 sets - 10 reps ?- Squat with Counter Support  - 1 x daily - 7 x weekly - 1-2 sets - 10 reps - 5 hold ? ? ?ASSESSMENT: ? ?CLINICAL IMPRESSION: Patient presents with Lt knee stiffness, pain, weakness, and difficulty walking S/P Lt TKA 10/29/21. He is doing quite well to this point but will benefit from skilled PT to address below impairments, limitations and improve overall function. ? ?OBJECTIVE IMPAIRMENTS: decreased activity tolerance, difficulty walking, decreased balance, decreased endurance, decreased mobility, decreased ROM, decreased strength, impaired flexibility, impaired LE use, postural dysfunction, and pain. ? ?ACTIVITY LIMITATIONS: bending, lifting, carry, locomotion, cleaning, community activity, driving, and or occupation ? ?PERSONAL FACTORS: Lt TKR 11/04/21, CAD, HTN, sleep apnea, partial rt knee replacement are also affecting patient's functional outcome. ? ?REHAB POTENTIAL: Good ? ?CLINICAL DECISION MAKING: Stable/uncomplicated ? ?EVALUATION COMPLEXITY: Low ? ? ? ?GOALS: ?Short term PT Goals Target date: 12/23/2021 ?Pt will be I and compliant with HEP. ?Baseline:  ?Goal status: New ?Pt will  decrease pain by 25% overall ?Baseline: ?Goal status: New ? ?Long term PT goals Target date: 01/20/2022 ?Pt [facility] to improve functional mobility ?Baseline: ?Goal status: New ?Pt will improve  hip/knee strength to at least 5-/5 MMT to improve functional strength ?Baseline: ?Goal status: New ?Pt will improve FOTO to at least 66% functional to show improved function ?Baseline: ?Goal status: New ?Pt will reduce pain by overall 50% overall with usual activity ?Baseline: ?Goal status: New ?Pt will reduce pain to overall less than 2-3/10 with usual activity and work activity. ?Baseline: ?Goal status: New ?Pt will be able to ambulate community distances at least 1000 ft WNL gait pattern without complaints ?Baseline: ?Goal status: New ? ?PLAN: ?PT FREQUENCY: 1-2 times per week  ? ?PT DURATION: 6-8 weeks ? ?PLANNED INTERVENTIONS (unless contraindicated): aquatic PT, Canalith repositioning, cryotherapy, Electrical  stimulation, Iontophoresis with 4 mg/ml dexamethasome, Moist heat, traction, Ultrasound, gait training, Therapeutic exercise, balance training, neuromuscular re-education, patient/family education, prosthetic training, manual techniques, passive ROM, dry needling, taping, vasopnuematic device, vestibular, spinal manipulations, joint manipulations ? ?PLAN FOR NEXT SESSION: knee flexion emphasis, how is HEP ? ? ? ?April Manson, PT,DPT ?11/25/2021, 1:52 PM ? ?

## 2021-11-26 ENCOUNTER — Encounter: Payer: Self-pay | Admitting: Physical Therapy

## 2021-11-26 ENCOUNTER — Ambulatory Visit (INDEPENDENT_AMBULATORY_CARE_PROVIDER_SITE_OTHER): Payer: BC Managed Care – PPO | Admitting: Physical Therapy

## 2021-11-26 DIAGNOSIS — R600 Localized edema: Secondary | ICD-10-CM

## 2021-11-26 DIAGNOSIS — M6281 Muscle weakness (generalized): Secondary | ICD-10-CM | POA: Diagnosis not present

## 2021-11-26 DIAGNOSIS — M25562 Pain in left knee: Secondary | ICD-10-CM | POA: Diagnosis not present

## 2021-11-26 DIAGNOSIS — M25662 Stiffness of left knee, not elsewhere classified: Secondary | ICD-10-CM | POA: Diagnosis not present

## 2021-11-26 DIAGNOSIS — R2689 Other abnormalities of gait and mobility: Secondary | ICD-10-CM

## 2021-11-26 NOTE — Therapy (Signed)
?OUTPATIENT PHYSICAL THERAPY TREATMENT NOTE ? ? ?Patient Name: David Livingston ?MRN: [id number] ?DOB:October 09, 1955, 66 y.o., male ?Today's Date: 11/26/2021 ? ?PCP: Kelvin Cellar, MD ?REFERRING PROVIDER: Aundra Dubin, PA-C ? ?END OF SESSION:  ? PT End of Session - 11/26/21 1404   ? ? Visit Number 2   ? Number of Visits 18   ? Date for PT Re-Evaluation 01/20/22   ? PT Start Time 1345   ? PT Stop Time 1440   ? PT Time Calculation (min) 55 min   ? Activity Tolerance Patient tolerated treatment well   ? Behavior During Therapy [facility] for tasks assessed/performed   ? ?  ?  ? ?  ? ? ?Past Medical History:  ?Diagnosis Date  ? Anginal pain ([place])   ? Arthritis   ? Coronary artery disease   ? Hepatitis   ? Hypertension   ? Sleep apnea   ? ?Past Surgical History:  ?Procedure Laterality Date  ? CARDIAC CATHETERIZATION    ? ~ 2005 stent to D1; 10/30/10: DES dRCA with 0% residual stenosis & cutting balloon angioplasty D1 for ISR with 70% stenosis reduced to 30% ([place], [place])  ? JOINT REPLACEMENT    ? partial right knee replacement  ? TONSILLECTOMY    ? TOTAL KNEE ARTHROPLASTY Left 11/04/2021  ? Procedure: LEFT TOTAL KNEE ARTHROPLASTY;  Surgeon: Leandrew Koyanagi, MD;  Location: [facility];  Service: Orthopedics;  Laterality: Left;  ? ?Patient Active Problem List  ? Diagnosis Date Noted  ? Status post total left knee replacement 11/04/2021  ? Primary osteoarthritis of left knee 08/31/2021  ? ? ?THERAPY DIAG:  ?Acute pain of left knee ? ?Stiffness of left knee, not elsewhere classified ? ?Muscle weakness (generalized) ? ?Other abnormalities of gait and mobility ? ?Localized edema ?PCP: Kelvin Cellar, MD ?  ?REFERRING PROVIDER: Leandrew Koyanagi, MD ?  ?REFERRING DIAG: [id number] (ICD-10-CM) - Hx of total knee replacement, left ?  ?THERAPY DIAG:  ?Acute pain of left knee ?  ?Stiffness of left knee, not elsewhere classified ?  ?Muscle weakness (generalized) ?  ?Other abnormalities of gait and mobility ?  ?Localized edema ?  ?ONSET DATE:  Lt TKA 11/04/21 ?  ?SUBJECTIVE:  ?  ?SUBJECTIVE STATEMENT: ?He relays some stiffness but not much pain overall in his left knee, he has been stretching at home ?  ?PERTINENT HISTORY: ?Lt TKA 11/04/21, CAD, HTN, sleep apnea, partial rt knee replacement ?  ?PAIN:  ?Are you having pain? Yes: NPRS scale: 1-2/10 ?Pain location: front and sides of knees ?Pain description: discomfort, tight ?Aggravating factors: flexing his knee ?Relieving factors: nothing in paticular ?  ?PRECAUTIONS: None ?  ?WEIGHT BEARING RESTRICTIONS No ?  ?FALLS:  ?Has patient fallen in last 6 months? No ?  ?OCCUPATION: sales ?  ?PLOF: Independent ?  ?PATIENT GOALS reduce pain and get more ROM ?  ?  ?OBJECTIVE:  ?  ?DIAGNOSTIC FINDINGS: Xray in chart ?  ?PATIENT SURVEYS:  ?FOTO 51% functional, goal is 66% ?  ?COGNITION: ?          Overall cognitive status: Within functional limits for tasks assessed               ?           ?SENSATION: light touch intact ?  ?MUSCLE LENGTH: ?Tight hamstrings and quads on Left ?  ?POSTURE:  ?WFL but decreased weight shift to left ?  ?LE ROM: ?  ?AROM/PROM Right ?11/25/2021 Left ?  11/25/2021  ?Hip flexion      ?Hip extension      ?Hip abduction      ?Hip adduction      ?Hip internal rotation      ?Hip external rotation      ?Knee flexion 124/ 90/100  ?Knee extension 0/ 2/0  ?Ankle dorsiflexion      ?Ankle plantarflexion      ?Ankle inversion      ?Ankle eversion      ? (Blank rows = not tested) ?  ?LE MMT: ?  ?MMT in sitting Left ?11/25/2021  ?Hip flexion 4+  ?Hip extension    ?Hip abduction 4+  ?Hip adduction    ?Hip internal rotation    ?Hip external rotation    ?Knee flexion 4  ?Knee extension 4+  ?Ankle dorsiflexion    ?Ankle plantarflexion    ?Ankle inversion    ?Ankle eversion    ? (Blank rows = not tested) ?  ?  ?FUNCTIONAL TESTS:  ?11/25/21 ?5 times sit to stand: 10 seconds without UE support but decreased weight shift to left ?  ?GAIT: ?Distance walked: 50 ?Assistive device utilized: came in with single walking stick  but can ambulate short distance without AD ?Level of assistance: Modified independence ?Comments: slower velocity, mild antalgic gait with decreased weight shift to left ?  ?  ?  ?TODAY'S TREATMENT: ?11/26/21 ?Nu step L6 X 10 min with emphasis on max flexion ?Standing gastroc stretch bilat on slantboard 30 sec X 3 ?Standing lunge stretch for knee flexion with left foot on treadmill 10 sec X10 ?Step ups 6 inch X 10 on Lt fwd and lateral with UE support ?Leg press machine DL 75# 3X10 then Lt leg only 25# X15 ?Supine AAROM heelslides 5 sec X 15 ?Vasopnuematic X 10 min to Left knee medium compression 34 deg ? ? ?11/25/21 ?Nu step L5 X 8 min seat#6 for max flexion ?Lt leg supine heelslides AAROM 5 sec X 10 ?Lt knee PROM and flexion mobs to tolerance ?Reviewed HEP listed below ?  ?  ?PATIENT EDUCATION:  ?Education details: HEP,POC ?Person educated: Patient ?Education method: Explanation, Demonstration, Verbal cues, and Handouts ?Education comprehension: verbalized understanding, returned demonstration, and needs further education ?  ?  ?HOME EXERCISE PROGRAM: ?Access Code: [email or web address] ?URL: [email or web address] ?Date: 11/25/2021 ?Prepared by: [name] ?  ?Exercises ?- standing knee flexion stretch on chair or step  - 2 x daily - 6 x weekly - 1 sets - 10 reps - 10 hold ?- Supine Heel Slide with Strap  - 2 x daily - 6 x weekly - 1-2 sets - 10 reps - 5 hold ?- Supine Quadriceps Stretch with Strap on Table  - 2 x daily - 6 x weekly - 2-3 reps - 30 hold ?- Seated Straight Leg Raise with Quad Contraction  - 2 x daily - 6 x weekly - 2-3 sets - 10 reps ?- Squat with Counter Support  - 1 x daily - 7 x weekly - 1-2 sets - 10 reps - 5 hold ?  ?  ?ASSESSMENT: ?  ?CLINICAL IMPRESSION:  ?11/26/21 ?He already is showing some progress with his knee flexion ROM but still limited with this. We began light strengthening today with good tolerance and we will monitor his soreness to this.  ? ?11/25/21 ?Patient presents with Lt  knee stiffness, pain, weakness, and difficulty walking S/P Lt TKA 10/29/21. He is doing quite well to this point but will benefit from skilled PT  to address below impairments, limitations and improve overall function. ?  ?OBJECTIVE IMPAIRMENTS: decreased activity tolerance, difficulty walking, decreased balance, decreased endurance, decreased mobility, decreased ROM, decreased strength, impaired flexibility, impaired LE use, postural dysfunction, and pain. ?  ?ACTIVITY LIMITATIONS: bending, lifting, carry, locomotion, cleaning, community activity, driving, and or occupation ?  ?PERSONAL FACTORS: Lt TKR 11/04/21, CAD, HTN, sleep apnea, partial rt knee replacement are also affecting patient's functional outcome. ?  ?REHAB POTENTIAL: Good ?  ?CLINICAL DECISION MAKING: Stable/uncomplicated ?  ?EVALUATION COMPLEXITY: Low ?  ?  ?  ?GOALS: ?Short term PT Goals Target date: 12/23/2021 ?Pt will be I and compliant with HEP. ?Baseline:  ?Goal status: New ?Pt will decrease pain by 25% overall ?Baseline: ?Goal status: New ?  ?Long term PT goals Target date: 01/20/2022 ?Pt will improve ROM to [facility] to improve functional mobility ?Baseline: ?Goal status: New ?Pt will improve  hip/knee strength to at least 5-/5 MMT to improve functional strength ?Baseline: ?Goal status: New ?Pt will improve FOTO to at least 66% functional to show improved function ?Baseline: ?Goal status: New ?Pt will reduce pain by overall 50% overall with usual activity ?Baseline: ?Goal status: New ?Pt will reduce pain to overall less than 2-3/10 with usual activity and work activity. ?Baseline: ?Goal status: New ?Pt will be able to ambulate community distances at least 1000 ft WNL gait pattern without complaints ?Baseline: ?Goal status: New ?  ?PLAN: ?PT FREQUENCY: 1-2 times per week  ?  ?PT DURATION: 6-8 weeks ?  ?PLANNED INTERVENTIONS (unless contraindicated): aquatic PT, Canalith repositioning, cryotherapy, Electrical stimulation, Iontophoresis with 4 mg/ml  dexamethasome, Moist heat, traction, Ultrasound, gait training, Therapeutic exercise, balance training, neuromuscular re-education, patient/family education, prosthetic training, manual techniques, passive ROM

## 2021-11-28 ENCOUNTER — Encounter: Payer: Self-pay | Admitting: Rehabilitative and Restorative Service Providers"

## 2021-11-28 ENCOUNTER — Ambulatory Visit (INDEPENDENT_AMBULATORY_CARE_PROVIDER_SITE_OTHER): Payer: BC Managed Care – PPO | Admitting: Rehabilitative and Restorative Service Providers"

## 2021-11-28 DIAGNOSIS — M25662 Stiffness of left knee, not elsewhere classified: Secondary | ICD-10-CM

## 2021-11-28 DIAGNOSIS — M25562 Pain in left knee: Secondary | ICD-10-CM | POA: Diagnosis not present

## 2021-11-28 DIAGNOSIS — R600 Localized edema: Secondary | ICD-10-CM

## 2021-11-28 DIAGNOSIS — M6281 Muscle weakness (generalized): Secondary | ICD-10-CM

## 2021-11-28 DIAGNOSIS — R2689 Other abnormalities of gait and mobility: Secondary | ICD-10-CM

## 2021-11-28 NOTE — Therapy (Signed)
?OUTPATIENT PHYSICAL THERAPY TREATMENT NOTE ? ? ?Patient Name: David Livingston ?MRN: [id number] ?DOB:15-Sep-1955, 66 y.o., male ?Today's Date: 11/28/2021 ? ?PCP: Kelvin Cellar, MD ?REFERRING PROVIDER: Aundra Dubin, PA-C ? ?END OF SESSION:  ? PT End of Session - 11/28/21 3716   ? ? Visit Number 3   ? Number of Visits 18   ? Date for PT Re-Evaluation 01/20/22   ? PT Start Time 0930   ? PT Stop Time 1030   ? PT Time Calculation (min) 60 min   ? Activity Tolerance Patient tolerated treatment well;No increased pain   ? Behavior During Therapy [facility] for tasks assessed/performed   ? ?  ?  ? ?  ? ? ? ?Past Medical History:  ?Diagnosis Date  ? Anginal pain ([place])   ? Arthritis   ? Coronary artery disease   ? Hepatitis   ? Hypertension   ? Sleep apnea   ? ?Past Surgical History:  ?Procedure Laterality Date  ? CARDIAC CATHETERIZATION    ? ~ 2005 stent to D1; 10/30/10: DES dRCA with 0% residual stenosis & cutting balloon angioplasty D1 for ISR with 70% stenosis reduced to 30% ([place], [place])  ? JOINT REPLACEMENT    ? partial right knee replacement  ? TONSILLECTOMY    ? TOTAL KNEE ARTHROPLASTY Left 11/04/2021  ? Procedure: LEFT TOTAL KNEE ARTHROPLASTY;  Surgeon: Leandrew Koyanagi, MD;  Location: [place];  Service: Orthopedics;  Laterality: Left;  ? ?Patient Active Problem List  ? Diagnosis Date Noted  ? Status post total left knee replacement 11/04/2021  ? Primary osteoarthritis of left knee 08/31/2021  ? ? ?THERAPY DIAG:  ?Other abnormalities of gait and mobility ? ?Muscle weakness (generalized) ? ?Stiffness of left knee, not elsewhere classified ? ?Acute pain of left knee ? ?Localized edema ?PCP: Kelvin Cellar, MD ?  ?REFERRING PROVIDER: Leandrew Koyanagi, MD ?  ?REFERRING DIAG: R67.893 (ICD-10-CM) - Hx of total knee replacement, left ?  ?THERAPY DIAG:  ?Acute pain of left knee ?  ?Stiffness of left knee, not elsewhere classified ?  ?Muscle weakness (generalized) ?  ?Other abnormalities of gait and mobility ?  ?Localized  edema ?  ?ONSET DATE: Lt TKA 11/04/21 ?  ?SUBJECTIVE:  ?  ?SUBJECTIVE STATEMENT: ?Froylan reports good early HEP compliance.  He reports getting 6 hours of sleep last night (usually closer to 3-4 hours/night). ?  ?PERTINENT HISTORY: ?Lt TKA 11/04/21, CAD, HTN, sleep apnea, partial rt knee replacement ?  ?PAIN:  ?Are you having pain? Yes: NPRS scale: 1-2/10 ?Pain location: Anterior and lateral knee ?Pain description: Discomfort, tightness ?Aggravating factors: AROM ?Relieving factors: Exercise, ice and change of position ?  ?PRECAUTIONS: None ?  ?WEIGHT BEARING RESTRICTIONS No ?  ?FALLS:  ?Has patient fallen in last 6 months? No ?  ?OCCUPATION: sales ?  ?PLOF: Independent ?  ?PATIENT GOALS reduce pain and get more ROM ?  ?  ?OBJECTIVE:  ?  ?DIAGNOSTIC FINDINGS: Xray in chart ?  ?PATIENT SURVEYS:  ?FOTO 51% functional, goal is 66% ?  ?COGNITION: ?          Overall cognitive status: Within functional limits for tasks assessed               ?           ?SENSATION: light touch intact ?  ?MUSCLE LENGTH: ?Tight hamstrings and quads on Left ?  ?POSTURE:  ?WFL but decreased weight shift to left ?  ?LE ROM: ?  ?  AROM/PROM Right ?11/25/2021 Left ?11/25/2021  ?Hip flexion      ?Hip extension      ?Hip abduction      ?Hip adduction      ?Hip internal rotation      ?Hip external rotation      ?Knee flexion 124/ 90/100  ?Knee extension 0/ 2/0  ?Ankle dorsiflexion      ?Ankle plantarflexion      ?Ankle inversion      ?Ankle eversion      ? (Blank rows = not tested) ?  ?LE MMT: ?  ?MMT in sitting Left ?11/25/2021  ?Hip flexion 4+  ?Hip extension    ?Hip abduction 4+  ?Hip adduction    ?Hip internal rotation    ?Hip external rotation    ?Knee flexion 4  ?Knee extension 4+  ?Ankle dorsiflexion    ?Ankle plantarflexion    ?Ankle inversion    ?Ankle eversion    ? (Blank rows = not tested) ?  ?  ?FUNCTIONAL TESTS:  ?11/25/21 ?5 times sit to stand: 10 seconds without UE support but decreased weight shift to left ?  ?GAIT: ?Distance walked:  50 ?Assistive device utilized: came in with single walking stick but can ambulate short distance without AD ?Level of assistance: Modified independence ?Comments: slower velocity, mild antalgic gait with decreased weight shift to left ?  ?  ?  ?TODAY'S TREATMENT: ?11/28/2021 ?Therapeutic Exercises: ?Recumbent bike Seat 9 for 8 minute AAROM ?[place] knee flexion 2 minutes ?AAROM knee flexion (R pushes L into flexion) 10X 10 seconds ?Seated straight leg raises 3 sets of 5 for 3 seconds with 1# ? ?Therapeutic Activities: ?Leg press machine DL 75# 3X10 then Lt leg only 25# 2X15 for sit to stand and stairs ? ?Vasopneumatic X 10 minutes L knee Medium compression 34 degrees ? ? ?11/26/21 ?Nu step L6 X 10 min with emphasis on max flexion ?Standing gastroc stretch bilat on slantboard 30 sec X 3 ?Standing lunge stretch for knee flexion with left foot on treadmill 10 sec X10 ?Step ups 6 inch X 10 on Lt fwd and lateral with UE support ?Leg press machine DL 75# 3X10 then Lt leg only 25# X15 ?Supine AAROM heelslides 5 sec X 15 ?Vasopnuematic X 10 min to Left knee medium compression 34 deg ? ? ?11/25/21 ?Nu step L5 X 8 min seat#6 for max flexion ?Lt leg supine heelslides AAROM 5 sec X 10 ?Lt knee PROM and flexion mobs to tolerance ?Reviewed HEP listed below ?  ?  ?PATIENT EDUCATION:  ?Education details: HEP,POC ?Person educated: Patient ?Education method: Explanation, Demonstration, Verbal cues, and Handouts ?Education comprehension: verbalized understanding, returned demonstration, and needs further education ?  ?  ?HOME EXERCISE PROGRAM: ?Access Code: [email or web address] ?URL: [email or web address] ?Date: 11/25/2021 ?Prepared by: [name] ?  ?Exercises ?- standing knee flexion stretch on chair or step  - 2 x daily - 6 x weekly - 1 sets - 10 reps - 10 hold ?- Supine Heel Slide with Strap  - 2 x daily - 6 x weekly - 1-2 sets - 10 reps - 5 hold ?- Supine Quadriceps Stretch with Strap on Table  - 2 x daily - 6 x weekly - 2-3  reps - 30 hold ?- Seated Straight Leg Raise with Quad Contraction  - 2 x daily - 6 x weekly - 2-3 sets - 10 reps ?- Squat with Counter Support  - 1 x daily - 7 x weekly - 1-2 sets - 10 reps -  5 hold ?  ?  ?ASSESSMENT: ?  ?CLINICAL IMPRESSION:  ?11/28/21 ?Jaquay is progressing with his post-surgical TKA rehabilitation.  Edema control, quadriceps strength and flexion AROM remain high priorities.  Extension AROM is excellent and only limited by edema.  Continue current POC to meet LTGs. ? ? ?11/26/21 ?He already is showing some progress with his knee flexion ROM but still limited with this. We began light strengthening today with good tolerance and we will monitor his soreness to this.  ? ? ?11/25/21 ?Patient presents with Lt knee stiffness, pain, weakness, and difficulty walking S/P Lt TKA 10/29/21. He is doing quite well to this point but will benefit from skilled PT to address below impairments, limitations and improve overall function. ?  ?OBJECTIVE IMPAIRMENTS: decreased activity tolerance, difficulty walking, decreased balance, decreased endurance, decreased mobility, decreased ROM, decreased strength, impaired flexibility, impaired LE use, postural dysfunction, and pain. ?  ?ACTIVITY LIMITATIONS: bending, lifting, carry, locomotion, cleaning, community activity, driving, and or occupation ?  ?PERSONAL FACTORS: Lt TKR 11/04/21, CAD, HTN, sleep apnea, partial rt knee replacement are also affecting patient's functional outcome. ?  ?REHAB POTENTIAL: Good ?  ?CLINICAL DECISION MAKING: Stable/uncomplicated ?  ?EVALUATION COMPLEXITY: Low ?  ?  ?  ?GOALS: ?Short term PT Goals Target date: 12/23/2021 ?Pt will be I and compliant with HEP. ?Baseline:  ?Goal status: Met 11/28/2021 ?Pt will decrease pain by 25% overall ?Baseline: ?Goal status: Met, pain is minimal 11/28/2021 ?  ?Long term PT goals Target date: 01/20/2022 ?Pt will improve ROM to [facility] to improve functional mobility ?Baseline: ?Goal status: New ?Pt will improve  hip/knee  strength to at least 5-/5 MMT to improve functional strength ?Baseline: ?Goal status: New ?Pt will improve FOTO to at least 66% functional to show improved function ?Baseline: ?Goal status: New ?Pt will reduce pain

## 2021-12-03 ENCOUNTER — Encounter: Payer: Self-pay | Admitting: Physical Therapy

## 2021-12-03 ENCOUNTER — Ambulatory Visit (INDEPENDENT_AMBULATORY_CARE_PROVIDER_SITE_OTHER): Payer: BC Managed Care – PPO | Admitting: Physical Therapy

## 2021-12-03 DIAGNOSIS — M25662 Stiffness of left knee, not elsewhere classified: Secondary | ICD-10-CM | POA: Diagnosis not present

## 2021-12-03 DIAGNOSIS — M25562 Pain in left knee: Secondary | ICD-10-CM | POA: Diagnosis not present

## 2021-12-03 DIAGNOSIS — R2689 Other abnormalities of gait and mobility: Secondary | ICD-10-CM

## 2021-12-03 DIAGNOSIS — M6281 Muscle weakness (generalized): Secondary | ICD-10-CM

## 2021-12-03 DIAGNOSIS — R600 Localized edema: Secondary | ICD-10-CM

## 2021-12-03 NOTE — Therapy (Signed)
?OUTPATIENT PHYSICAL THERAPY TREATMENT NOTE ? ? ?Patient Name: David Livingston ?MRN: [id number] ?DOB:1956/02/03, 66 y.o., male ?Today's Date: 12/03/2021 ? ?PCP: Jeralyn Bennett, MD ?REFERRING PROVIDER: Cristie Hem, PA-C ? ?END OF SESSION:  ? PT End of Session - 12/03/21 1444   ? ? Visit Number 4   ? Number of Visits 18   ? Date for PT Re-Evaluation 01/20/22   ? PT Start Time 1350   ? PT Stop Time 1435   ? PT Time Calculation (min) 45 min   ? Activity Tolerance Patient tolerated treatment well;No increased pain   ? Behavior During Therapy [facility] for tasks assessed/performed   ? ?  ?  ? ?  ? ? ? ? ?Past Medical History:  ?Diagnosis Date  ? Anginal pain (HCC)   ? Arthritis   ? Coronary artery disease   ? Hepatitis   ? Hypertension   ? Sleep apnea   ? ?Past Surgical History:  ?Procedure Laterality Date  ? CARDIAC CATHETERIZATION    ? ~ 2005 stent to D1; 10/30/10: DES dRCA with 0% residual stenosis & cutting balloon angioplasty D1 for ISR with 70% stenosis reduced to 30% ([facility], [place])  ? JOINT REPLACEMENT    ? partial right knee replacement  ? TONSILLECTOMY    ? TOTAL KNEE ARTHROPLASTY Left 11/04/2021  ? Procedure: LEFT TOTAL KNEE ARTHROPLASTY;  Surgeon: Tarry Kos, MD;  Location: [facility] OR;  Service: Orthopedics;  Laterality: Left;  ? ?Patient Active Problem List  ? Diagnosis Date Noted  ? Status post total left knee replacement 11/04/2021  ? Primary osteoarthritis of left knee 08/31/2021  ? ? ?THERAPY DIAG:  ?Other abnormalities of gait and mobility ? ?Muscle weakness (generalized) ? ?Stiffness of left knee, not elsewhere classified ? ?Acute pain of left knee ? ?Localized edema ?PCP: Jeralyn Bennett, MD ?  ?REFERRING PROVIDER: Tarry Kos, MD ?  ?REFERRING DIAG: P29.518 (ICD-10-CM) - Hx of total knee replacement, left ?  ?THERAPY DIAG:  ?Acute pain of left knee ?  ?Stiffness of left knee, not elsewhere classified ?  ?Muscle weakness (generalized) ?  ?Other abnormalities of gait and mobility ?  ?Localized  edema ?  ?ONSET DATE: Lt TKA 11/04/21 ?  ?SUBJECTIVE:  ?  ?SUBJECTIVE STATEMENT: ?Pt arriving today reporting sleeping about 6 hours each night which is an improvement. Pt reporting no pain at rest or with walking. Pt stating he has twinges at time. Pt reporting 2/10 with increased flexion exercises ?  ?PERTINENT HISTORY: ?Lt TKA 11/04/21, CAD, HTN, sleep apnea, partial rt knee replacement ?  ?PAIN:  ?Are you having pain? Yes: NPRS scale: 2/10 ?Pain location: Anterior and lateral knee ?Pain description: Discomfort, tightness ?Aggravating factors: AROM ?Relieving factors: Exercise, ice and change of position ?  ?PRECAUTIONS: None ?  ?WEIGHT BEARING RESTRICTIONS No ?  ?FALLS:  ?Has patient fallen in last 6 months? No ?  ?OCCUPATION: sales ?  ?PLOF: Independent ?  ?PATIENT GOALS reduce pain and get more ROM ?  ?  ?OBJECTIVE:  ?  ?DIAGNOSTIC FINDINGS: Xray in chart ?  ?PATIENT SURVEYS:  ?FOTO 51% functional, goal is 66% ?  ?COGNITION: ?          Overall cognitive status: Within functional limits for tasks assessed               ?           ?SENSATION: light touch intact ?  ?MUSCLE LENGTH: ?Tight hamstrings and quads on  Left ?  ?POSTURE:  ?WFL but decreased weight shift to left ?  ?LE ROM: ?  ?AROM/PROM Right ?11/25/2021 Left ?11/25/2021 Left ?12/03/2021  ?Hip flexion       ?Hip extension       ?Hip abduction       ?Hip adduction       ?Hip internal rotation       ?Hip external rotation       ?Knee flexion 124/ 90/100 102/105  ?Knee extension 0/ 2/0 2/0  ?Ankle dorsiflexion       ?Ankle plantarflexion       ?Ankle inversion       ?Ankle eversion       ? (Blank rows = not tested) ?  ?LE MMT: ?  ?MMT in sitting Left ?11/25/2021  ?Hip flexion 4+  ?Hip extension    ?Hip abduction 4+  ?Hip adduction    ?Hip internal rotation    ?Hip external rotation    ?Knee flexion 4  ?Knee extension 4+  ?Ankle dorsiflexion    ?Ankle plantarflexion    ?Ankle inversion    ?Ankle eversion    ? (Blank rows = not tested) ?  ?  ?FUNCTIONAL TESTS:   ?11/25/21 ?5 times sit to stand: 10 seconds without UE support but decreased weight shift to left ?  ?GAIT: ?Distance walked: 50 ?Assistive device utilized: came in with single walking stick but can ambulate short distance without AD ?Level of assistance: Modified independence ?Comments: slower velocity, mild antalgic gait with decreased weight shift to left ?  ?  ?  ?TODAY'S TREATMENT: ?12/03/2021: ?Therapeutic Exercises: ?Recumbent bike Seat 9 for 8 minute AAROM, partial revolutions progressing to full  ?Step ups on 6 inch step with no UE support  ?Lateral step ups 6 inch step with single UE support ?Seated straight leg raises 3 sets of 5 for 3 seconds with 2# ?Leg Press: 87# 3 x 10 bilateral LE ?Leg Press: Lt LE only 31# 2  x 10 ?Supine SLR with 5# ankle weight x 15  ?Manual:  ? Contract/Relax with contralateral LE performing opposite motion x 10 ?PROM in flexion and extension of Lt knee  ?Modalities: ?Vasopneumatic X 10 minutes L knee Medium compression 34 degrees ? ? ? ?11/28/2021 ?Therapeutic Exercises: ?Recumbent bike Seat 9 for 8 minute AAROM ?Tailgate knee flexion 2 minutes ?AAROM knee flexion (R pushes L into flexion) 10X 10 seconds ?Seated straight leg raises 3 sets of 5 for 3 seconds with 1# ? ?Therapeutic Activities: ?Leg press machine DL 16#75# [id number] then Lt leg only 25# 2X15 for sit to stand and stairs ? ?Vasopneumatic X 10 minutes L knee Medium compression 34 degrees ? ? ?11/26/21 ?Nu step L6 X 10 min with emphasis on max flexion ?Standing gastroc stretch bilat on slantboard 30 sec X 3 ?Standing lunge stretch for knee flexion with left foot on treadmill 10 sec X10 ?Step ups 6 inch X 10 on Lt fwd and lateral with UE support ?Leg press machine DL 04#75# [id number] then Lt leg only 25# X15 ?Supine AAROM heelslides 5 sec X 15 ?Vasopnuematic X 10 min to Left knee medium compression 34 deg ? ? ?11/25/21 ?Nu step L5 X 8 min seat#6 for max flexion ?Lt leg supine heelslides AAROM 5 sec X 10 ?Lt knee PROM and flexion mobs to  tolerance ?Reviewed HEP listed below ?  ?  ?PATIENT EDUCATION:  ?Education details: HEP,POC ?Person educated: Patient ?Education method: Explanation, Demonstration, Verbal cues, and Handouts ?Education comprehension: verbalized  understanding, returned demonstration, and needs further education ?  ?  ?HOME EXERCISE PROGRAM: ?Access Code: [email or web address] ?URL: [email or web address] ?Date: 11/25/2021 ?Prepared by: Ivery Quale ?  ?Exercises ?- standing knee flexion stretch on chair or step  - 2 x daily - 6 x weekly - 1 sets - 10 reps - 10 hold ?- Supine Heel Slide with Strap  - 2 x daily - 6 x weekly - 1-2 sets - 10 reps - 5 hold ?- Supine Quadriceps Stretch with Strap on Table  - 2 x daily - 6 x weekly - 2-3 reps - 30 hold ?- Seated Straight Leg Raise with Quad Contraction  - 2 x daily - 6 x weekly - 2-3 sets - 10 reps ?- Squat with Counter Support  - 1 x daily - 7 x weekly - 1-2 sets - 10 reps - 5 hold ?  ?  ?ASSESSMENT: ?  ?CLINICAL IMPRESSION:  ?12/03/21: ?Pt tolerating treatment well with mild pain reported with increased knee flexion exercises. No pain reported at rest. Pt reporting compliance in his HEP. Continue to progress quad and hamstring strength as well as AROM and dynamic balance.  ? ?11/28/21 ?Hilbert is progressing with his post-surgical TKA rehabilitation.  Edema control, quadriceps strength and flexion AROM remain high priorities.  Extension AROM is excellent and only limited by edema.  Continue current POC to meet LTGs. ? ? ?11/26/21 ?He already is showing some progress with his knee flexion ROM but still limited with this. We began light strengthening today with good tolerance and we will monitor his soreness to this.  ? ? ?11/25/21 ?Patient presents with Lt knee stiffness, pain, weakness, and difficulty walking S/P Lt TKA 10/29/21. He is doing quite well to this point but will benefit from skilled PT to address below impairments, limitations and improve overall function. ?  ?OBJECTIVE IMPAIRMENTS:  decreased activity tolerance, difficulty walking, decreased balance, decreased endurance, decreased mobility, decreased ROM, decreased strength, impaired flexibility, impaired LE use, postural dysfunction,

## 2021-12-06 ENCOUNTER — Other Ambulatory Visit: Payer: Self-pay

## 2021-12-06 ENCOUNTER — Ambulatory Visit (INDEPENDENT_AMBULATORY_CARE_PROVIDER_SITE_OTHER): Payer: BC Managed Care – PPO | Admitting: Rehabilitative and Restorative Service Providers"

## 2021-12-06 ENCOUNTER — Encounter: Payer: Self-pay | Admitting: Rehabilitative and Restorative Service Providers"

## 2021-12-06 DIAGNOSIS — M6281 Muscle weakness (generalized): Secondary | ICD-10-CM | POA: Diagnosis not present

## 2021-12-06 DIAGNOSIS — R2689 Other abnormalities of gait and mobility: Secondary | ICD-10-CM

## 2021-12-06 DIAGNOSIS — M25662 Stiffness of left knee, not elsewhere classified: Secondary | ICD-10-CM

## 2021-12-06 DIAGNOSIS — M25562 Pain in left knee: Secondary | ICD-10-CM | POA: Diagnosis not present

## 2021-12-06 DIAGNOSIS — R600 Localized edema: Secondary | ICD-10-CM

## 2021-12-06 NOTE — Therapy (Signed)
?OUTPATIENT PHYSICAL THERAPY TREATMENT NOTE ? ? ?Patient Name: David Livingston ?MRN: [id number] ?DOB:1955-12-31, 66 y.o., male ?Today's Date: 12/06/2021 ? ?END OF SESSION:  ? PT End of Session - 12/06/21 0941   ? ? Visit Number 5   ? Number of Visits 18   ? Date for PT Re-Evaluation 01/20/22   ? PT Start Time [phone]   ? PT Stop Time 1017   ? PT Time Calculation (min) 50 min   ? Activity Tolerance Patient tolerated treatment well   ? Behavior During Therapy [facility] for tasks assessed/performed   ? ?  ?  ? ?  ? ? ? ? ? ?Past Medical History:  ?Diagnosis Date  ? Anginal pain ([place])   ? Arthritis   ? Coronary artery disease   ? Hepatitis   ? Hypertension   ? Sleep apnea   ? ?Past Surgical History:  ?Procedure Laterality Date  ? CARDIAC CATHETERIZATION    ? ~ 2005 stent to D1; 10/30/10: DES dRCA with 0% residual stenosis & cutting balloon angioplasty D1 for ISR with 70% stenosis reduced to 30% ([place], [place])  ? JOINT REPLACEMENT    ? partial right knee replacement  ? TONSILLECTOMY    ? TOTAL KNEE ARTHROPLASTY Left 11/04/2021  ? Procedure: LEFT TOTAL KNEE ARTHROPLASTY;  Surgeon: Leandrew Koyanagi, MD;  Location: [place];  Service: Orthopedics;  Laterality: Left;  ? ?Patient Active Problem List  ? Diagnosis Date Noted  ? Status post total left knee replacement 11/04/2021  ? Primary osteoarthritis of left knee 08/31/2021  ? ? ?THERAPY DIAG:  ?Other abnormalities of gait and mobility ? ?Muscle weakness (generalized) ? ?Stiffness of left knee, not elsewhere classified ? ?Acute pain of left knee ? ?Localized edema ? ?PCP: Kelvin Cellar, MD ?  ?REFERRING PROVIDER: Leandrew Koyanagi, MD ?  ?REFERRING DIAG: [id number] (ICD-10-CM) - Hx of total knee replacement, left ?  ?THERAPY DIAG:  ?Acute pain of left knee ?  ?Stiffness of left knee, not elsewhere classified ?  ?Muscle weakness (generalized) ?  ?Other abnormalities of gait and mobility ?  ?Localized edema ?  ?ONSET DATE: Lt TKA 11/04/21 ?  ?SUBJECTIVE:  ?  ?SUBJECTIVE STATEMENT: ?Pt  indicated he had increased swelling and stiffness yesterday (had to wash compression stockings).  Pt indicated better this morning.  Pt indicated low twinge at time.  ?  ?PERTINENT HISTORY: ?Lt TKA 11/04/21, CAD, HTN, sleep apnea, partial rt knee replacement ?  ?PAIN:  ?Are you having pain? Yes: NPRS scale: nothing specific reported. ?Pain location: Anterior and lateral knee ?Pain description: Discomfort, tightness ?Aggravating factors: AROM ?Relieving factors: Exercise, ice and change of position ?  ?PRECAUTIONS: None ?  ?WEIGHT BEARING RESTRICTIONS No ?  ?FALLS:  ?Has patient fallen in last 6 months? No ?  ?OCCUPATION: sales ?  ?PLOF: Independent ?  ?PATIENT GOALS reduce pain and get more ROM ?  ?  ?OBJECTIVE:  ?  ? ?PATIENT SURVEYS:  ?11/25/2021 FOTO 51% functional, goal is 66% ?  ?COGNITION: ?          11/25/2021 Overall cognitive status: Within functional limits for tasks assessed               ?           ?SENSATION:  ?11/25/2021 light touch intact ?  ?MUSCLE LENGTH: ?11/25/2021 Tight hamstrings and quads on Left ?  ?POSTURE:  ?11/25/2021 WFL but decreased weight shift to left ?  ?LE ROM: ?  ?  AROM/PROM Right ?11/25/2021 Left ?11/25/2021 Left ?12/03/2021 Left ?12/06/2021  ?Hip flexion        ?Hip extension        ?Hip abduction        ?Hip adduction        ?Hip internal rotation        ?Hip external rotation        ?Knee flexion 124/ 90/100 102/105 AROM in supine heel slide: ?104  ?Knee extension 0/ 2/0 2/0   ?Ankle dorsiflexion        ?Ankle plantarflexion        ?Ankle inversion        ?Ankle eversion        ? (Blank rows = not tested) ?  ?LE MMT: ?  ?MMT in sitting Left ?11/25/2021  ?Hip flexion 4+  ?Hip extension    ?Hip abduction 4+  ?Hip adduction    ?Hip internal rotation    ?Hip external rotation    ?Knee flexion 4  ?Knee extension 4+  ?Ankle dorsiflexion    ?Ankle plantarflexion    ?Ankle inversion    ?Ankle eversion    ? (Blank rows = not tested) ?  ?  ?FUNCTIONAL TESTS:  ?11/25/21 ?5 times sit to stand: 10  seconds without UE support but decreased weight shift to left ?  ?GAIT: ?11/25/2021: ?Distance walked: 50 ?Assistive device utilized: came in with single walking stick but can ambulate short distance without AD ?Level of assistance: Modified independence ?Comments: slower velocity, mild antalgic gait with decreased weight shift to left ?  ?  ?  ?TODAY'S TREATMENT: ?12/06/2021: ?Therapeutic Exercises: ?Recumbent bike Seat 9 for 8 minute AAROM, partial revolutions progressing to full  ?Seated straight leg raises 3 sets of 5 for 3 seconds with 2# ?Incline board standing calf stretch 30 sec x 3  ?Supine AROM heel slide Lt x 10 ?Seated LAQ 5 lbs 2 x 15 ? ?Therapeutic Activity ?Leg Press: 93 lbs x 15 bilateral LE ?Leg Press: Lt LE only 43 lbs 2 x 15  rest breaks in flexion stretch 30 sec after each set ?Step on and over/down WB on Lt leg c one hand rail assist x 15 ? ?Manual:  ? Seated Lt knee flexion c IR/distraction mobilization c movement ?Modalities: ?Vasopneumatic X 10 minutes L knee Medium compression 34 degrees ? ?12/03/2021: ?Therapeutic Exercises: ?Recumbent bike Seat 9 for 8 minute AAROM, partial revolutions progressing to full  ?Step ups on 6 inch step with no UE support  ?Lateral step ups 6 inch step with single UE support ?Seated straight leg raises 3 sets of 5 for 3 seconds with 2# ?Leg Press: 87# 3 x 10 bilateral LE ?Leg Press: Lt LE only 31# 2  x 10 ?Supine SLR with 5# ankle weight x 15  ?Manual:  ? Contract/Relax with contralateral LE performing opposite motion x 10 ?PROM in flexion and extension of Lt knee  ?Modalities: ?Vasopneumatic X 10 minutes L knee Medium compression 34 degrees ? ? ? ?11/28/2021 ?Therapeutic Exercises: ?Recumbent bike Seat 9 for 8 minute AAROM ?Tailgate knee flexion 2 minutes ?AAROM knee flexion (R pushes L into flexion) 10X 10 seconds ?Seated straight leg raises 3 sets of 5 for 3 seconds with 1# ? ?Therapeutic Activities: ?Leg press machine DL 75# 3X10 then Lt leg only 25# 2X15 for  sit to stand and stairs ? ?Vasopneumatic X 10 minutes L knee Medium compression 34 degrees ? ? ?11/26/21 ?Nu step L6 X 10 min with emphasis on  max flexion ?Standing gastroc stretch bilat on slantboard 30 sec X 3 ?Standing lunge stretch for knee flexion with left foot on treadmill 10 sec X10 ?Step ups 6 inch X 10 on Lt fwd and lateral with UE support ?Leg press machine DL 75# 3X10 then Lt leg only 25# X15 ?Supine AAROM heelslides 5 sec X 15 ?Vasopnuematic X 10 min to Left knee medium compression 34 deg ? ?  ?PATIENT EDUCATION:  ?11/25/2021 ?Education details: HEP,POC ?Person educated: Patient ?Education method: Explanation, Demonstration, Verbal cues, and Handouts ?Education comprehension: verbalized understanding, returned demonstration, and needs further education ?  ?  ?HOME EXERCISE PROGRAM: ?11/25/2021 ?Access Code: [email or web address] ?URL: [email or web address] ?Date: 11/25/2021 ?Prepared by: [name] ?  ?Exercises ?- standing knee flexion stretch on chair or step  - 2 x daily - 6 x weekly - 1 sets - 10 reps - 10 hold ?- Supine Heel Slide with Strap  - 2 x daily - 6 x weekly - 1-2 sets - 10 reps - 5 hold ?- Supine Quadriceps Stretch with Strap on Table  - 2 x daily - 6 x weekly - 2-3 reps - 30 hold ?- Seated Straight Leg Raise with Quad Contraction  - 2 x daily - 6 x weekly - 2-3 sets - 10 reps ?- Squat with Counter Support  - 1 x daily - 7 x weekly - 1-2 sets - 10 reps - 5 hold ?  ?  ?ASSESSMENT: ?  ?CLINICAL IMPRESSION:  ?Pt to benefit from continued skilled PT services to facilitate progression on mobility and strength to achieve full independent ambulation and reaching established goals.  ?  ?OBJECTIVE IMPAIRMENTS: decreased activity tolerance, difficulty walking, decreased balance, decreased endurance, decreased mobility, decreased ROM, decreased strength, impaired flexibility, impaired LE use, postural dysfunction, and pain. ?  ?ACTIVITY LIMITATIONS: bending, lifting, carry, locomotion, cleaning,  community activity, driving, and or occupation ?  ?PERSONAL FACTORS: Lt TKR 11/04/21, CAD, HTN, sleep apnea, partial rt knee replacement are also affecting patient's functional outcome. ?  ?REHAB POTENTIAL: Good ?

## 2021-12-09 ENCOUNTER — Encounter: Payer: BC Managed Care – PPO | Admitting: Physical Therapy

## 2021-12-12 ENCOUNTER — Encounter: Payer: Self-pay | Admitting: Physical Therapy

## 2021-12-12 ENCOUNTER — Ambulatory Visit (INDEPENDENT_AMBULATORY_CARE_PROVIDER_SITE_OTHER): Payer: BC Managed Care – PPO | Admitting: Physical Therapy

## 2021-12-12 DIAGNOSIS — M25562 Pain in left knee: Secondary | ICD-10-CM | POA: Diagnosis not present

## 2021-12-12 DIAGNOSIS — M25662 Stiffness of left knee, not elsewhere classified: Secondary | ICD-10-CM | POA: Diagnosis not present

## 2021-12-12 DIAGNOSIS — R600 Localized edema: Secondary | ICD-10-CM

## 2021-12-12 DIAGNOSIS — M6281 Muscle weakness (generalized): Secondary | ICD-10-CM

## 2021-12-12 DIAGNOSIS — R2689 Other abnormalities of gait and mobility: Secondary | ICD-10-CM

## 2021-12-12 NOTE — Therapy (Signed)
?OUTPATIENT PHYSICAL THERAPY TREATMENT NOTE ? ? ?Patient Name: David Livingston ?MRN: [id number] ?DOB:07-16-1956, 66 y.o., male ?Today's Date: 12/12/2021 ? ?END OF SESSION:  ? PT End of Session - 12/12/21 1515   ? ? Visit Number 6   ? Number of Visits 18   ? Date for PT Re-Evaluation 01/20/22   ? PT Start Time 1510   ? PT Stop Time [id number]   ? PT Time Calculation (min) 45 min   ? Activity Tolerance Patient tolerated treatment well   ? Behavior During Therapy [facility] for tasks assessed/performed   ? ?  ?  ? ?  ? ? ? ? ? ? ?Past Medical History:  ?Diagnosis Date  ? Anginal pain ([place])   ? Arthritis   ? Coronary artery disease   ? Hepatitis   ? Hypertension   ? Sleep apnea   ? ?Past Surgical History:  ?Procedure Laterality Date  ? CARDIAC CATHETERIZATION    ? ~ 2005 stent to D1; 10/30/10: DES dRCA with 0% residual stenosis & cutting balloon angioplasty D1 for ISR with 70% stenosis reduced to 30% ([place], [place])  ? JOINT REPLACEMENT    ? partial right knee replacement  ? TONSILLECTOMY    ? TOTAL KNEE ARTHROPLASTY Left 11/04/2021  ? Procedure: LEFT TOTAL KNEE ARTHROPLASTY;  Surgeon: Leandrew Koyanagi, MD;  Location: [place];  Service: Orthopedics;  Laterality: Left;  ? ?Patient Active Problem List  ? Diagnosis Date Noted  ? Status post total left knee replacement 11/04/2021  ? Primary osteoarthritis of left knee 08/31/2021  ? ? ?THERAPY DIAG:  ?Other abnormalities of gait and mobility ? ?Muscle weakness (generalized) ? ?Stiffness of left knee, not elsewhere classified ? ?Acute pain of left knee ? ?Localized edema ? ?PCP: Kelvin Cellar, MD ?  ?REFERRING PROVIDER: Leandrew Koyanagi, MD ?  ?REFERRING DIAG: [id number] (ICD-10-CM) - Hx of total knee replacement, left ?  ?THERAPY DIAG:  ?Acute pain of left knee ?  ?Stiffness of left knee, not elsewhere classified ?  ?Muscle weakness (generalized) ?  ?Other abnormalities of gait and mobility ?  ?Localized edema ?  ?ONSET DATE: Lt TKA 11/04/21 ?  ?SUBJECTIVE:  ?  ?SUBJECTIVE STATEMENT: ?Still  has some tightness around knee.  Otherwise doing better each day. ? ?PERTINENT HISTORY: ?Lt TKA 11/04/21, CAD, HTN, sleep apnea, partial rt knee replacement ?  ?PAIN:  ?Are you having pain? No ?  ?PRECAUTIONS: None ?  ?WEIGHT BEARING RESTRICTIONS No ?  ?FALLS:  ?Has patient fallen in last 6 months? No ?  ?OCCUPATION: sales ?  ?PLOF: Independent ?  ?PATIENT GOALS reduce pain and get more ROM ?  ?  ?OBJECTIVE:  ?  ? ?PATIENT SURVEYS:  ?11/25/2021 FOTO 51% functional, goal is 66% ?  ?MUSCLE LENGTH: ?11/25/2021 Tight hamstrings and quads on Left ?  ?POSTURE:  ?11/25/2021 WFL but decreased weight shift to left ?  ?LE ROM: ?  ?AROM/PROM Right ?11/25/2021 Left ?11/25/2021 Left ?12/03/2021 Left ?12/06/2021 Left ?12/12/21  ?Hip flexion         ?Hip extension         ?Hip abduction         ?Hip adduction         ?Hip internal rotation         ?Hip external rotation         ?Knee flexion 124/ 90/100 102/105 AROM in supine heel slide: ?104 A: ?108 ?P:  ?113  ?Knee extension 0/ 2/0 2/0    ?  Ankle dorsiflexion         ?Ankle plantarflexion         ?Ankle inversion         ?Ankle eversion         ? (Blank rows = not tested) ?  ?LE MMT: ?  ?MMT in sitting Left ?11/25/2021  ?Hip flexion 4+  ?Hip extension    ?Hip abduction 4+  ?Hip adduction    ?Hip internal rotation    ?Hip external rotation    ?Knee flexion 4  ?Knee extension 4+  ?Ankle dorsiflexion    ?Ankle plantarflexion    ?Ankle inversion    ?Ankle eversion    ? (Blank rows = not tested) ?  ?  ?FUNCTIONAL TESTS:  ?11/25/21 ?5 times sit to stand: 10 seconds without UE support but decreased weight shift to left ?  ?GAIT: ?11/25/2021: ?Distance walked: 50 ?Assistive device utilized: came in with single walking stick but can ambulate short distance without AD ?Level of assistance: Modified independence ?Comments: slower velocity, mild antalgic gait with decreased weight shift to left ?  ?  ?  ?TODAY'S TREATMENT: ?12/12/21 ?Therex: ?     Aerobic: ?Recumbent bike seat 9, then 8, partial to full  revolutions x 4 min; L4 x 4 min ?    Machines: ?Bil leg press 93# 2x15; then LLE only 43# 2x15 ?    Standing: ?Gastroc stretch 3x30 sec on incline board ?    Supine: ?AA heel slide x 10 reps; with 5 sec hold ?    Sitting: ?Lt LAQ 3x10 with 5#; 5 sec hold ? ?Modalities: ?Vaso x 10 min; med pressure; 34 deg; Lt knee ? ? ?12/06/2021: ?Therapeutic Exercises: ?Recumbent bike Seat 9 for 8 minute AAROM, partial revolutions progressing to full  ?Seated straight leg raises 3 sets of 5 for 3 seconds with 2# ?Incline board standing calf stretch 30 sec x 3  ?Supine AROM heel slide Lt x 10 ?Seated LAQ 5 lbs 2 x 15 ? ?Therapeutic Activity ?Leg Press: 93 lbs x 15 bilateral LE ?Leg Press: Lt LE only 43 lbs 2 x 15  rest breaks in flexion stretch 30 sec after each set ?Step on and over/down WB on Lt leg c one hand rail assist x 15 ? ?Manual:  ? Seated Lt knee flexion c IR/distraction mobilization c movement ?Modalities: ?Vasopneumatic X 10 minutes L knee Medium compression 34 degrees ? ?12/03/2021: ?Therapeutic Exercises: ?Recumbent bike Seat 9 for 8 minute AAROM, partial revolutions progressing to full  ?Step ups on 6 inch step with no UE support  ?Lateral step ups 6 inch step with single UE support ?Seated straight leg raises 3 sets of 5 for 3 seconds with 2# ?Leg Press: 87# 3 x 10 bilateral LE ?Leg Press: Lt LE only 31# 2  x 10 ?Supine SLR with 5# ankle weight x 15  ?Manual:  ? Contract/Relax with contralateral LE performing opposite motion x 10 ?PROM in flexion and extension of Lt knee  ?Modalities: ?Vasopneumatic X 10 minutes L knee Medium compression 34 degrees ? ? ? ?11/28/2021 ?Therapeutic Exercises: ?Recumbent bike Seat 9 for 8 minute AAROM ?[place] knee flexion 2 minutes ?AAROM knee flexion (R pushes L into flexion) 10X 10 seconds ?Seated straight leg raises 3 sets of 5 for 3 seconds with 1# ? ?Therapeutic Activities: ?Leg press machine DL 75# 3X10 then Lt leg only 25# 2X15 for sit to stand and stairs ? ?Vasopneumatic X 10  minutes L knee Medium compression 34  degrees ? ? ?11/26/21 ?Nu step L6 X 10 min with emphasis on max flexion ?Standing gastroc stretch bilat on slantboard 30 sec X 3 ?Standing lunge stretch for knee flexion with left foot on treadmill 10 sec X10 ?Step ups 6 inch X 10 on Lt fwd and lateral with UE support ?Leg press machine DL 75# 3X10 then Lt leg only 25# X15 ?Supine AAROM heelslides 5 sec X 15 ?Vasopnuematic X 10 min to Left knee medium compression 34 deg ? ?  ?PATIENT EDUCATION:  ?11/25/2021 ?Education details: HEP,POC ?Person educated: Patient ?Education method: Explanation, Demonstration, Verbal cues, and Handouts ?Education comprehension: verbalized understanding, returned demonstration, and needs further education ?  ?  ?HOME EXERCISE PROGRAM: ?11/25/2021 ?Access Code: [email or web address] ?URL: [email or web address] ?Date: 11/25/2021 ?Prepared by: [name] ?  ?Exercises ?- standing knee flexion stretch on chair or step  - 2 x daily - 6 x weekly - 1 sets - 10 reps - 10 hold ?- Supine Heel Slide with Strap  - 2 x daily - 6 x weekly - 1-2 sets - 10 reps - 5 hold ?- Supine Quadriceps Stretch with Strap on Table  - 2 x daily - 6 x weekly - 2-3 reps - 30 hold ?- Seated Straight Leg Raise with Quad Contraction  - 2 x daily - 6 x weekly - 2-3 sets - 10 reps ?- Squat with Counter Support  - 1 x daily - 7 x weekly - 1-2 sets - 10 reps - 5 hold ?  ?  ?ASSESSMENT: ?  ?CLINICAL IMPRESSION:  ?Pt with good improvement in flexion today and overall doing well with PT.  Will continue to benefit from PT to maximize function. ?  ?OBJECTIVE IMPAIRMENTS: decreased activity tolerance, difficulty walking, decreased balance, decreased endurance, decreased mobility, decreased ROM, decreased strength, impaired flexibility, impaired LE use, postural dysfunction, and pain. ?  ?ACTIVITY LIMITATIONS: bending, lifting, carry, locomotion, cleaning, community activity, driving, and or occupation ?  ?PERSONAL FACTORS: Lt TKR 11/04/21, CAD,  HTN, sleep apnea, partial rt knee replacement are also affecting patient's functional outcome. ?  ?REHAB POTENTIAL: Good ?  ?CLINICAL DECISION MAKING: Stable/uncomplicated ?  ?EVALUATION COMPLEXITY: Low ?

## 2021-12-16 ENCOUNTER — Encounter: Payer: Self-pay | Admitting: Rehabilitative and Restorative Service Providers"

## 2021-12-16 ENCOUNTER — Ambulatory Visit (INDEPENDENT_AMBULATORY_CARE_PROVIDER_SITE_OTHER): Payer: BC Managed Care – PPO | Admitting: Rehabilitative and Restorative Service Providers"

## 2021-12-16 DIAGNOSIS — R2689 Other abnormalities of gait and mobility: Secondary | ICD-10-CM

## 2021-12-16 DIAGNOSIS — M6281 Muscle weakness (generalized): Secondary | ICD-10-CM | POA: Diagnosis not present

## 2021-12-16 DIAGNOSIS — M25662 Stiffness of left knee, not elsewhere classified: Secondary | ICD-10-CM

## 2021-12-16 DIAGNOSIS — R600 Localized edema: Secondary | ICD-10-CM

## 2021-12-16 DIAGNOSIS — M25562 Pain in left knee: Secondary | ICD-10-CM

## 2021-12-16 NOTE — Therapy (Addendum)
OUTPATIENT PHYSICAL THERAPY TREATMENT NOTE /PROGRESS NOTE/Discharge addendum PHYSICAL THERAPY DISCHARGE SUMMARY  Visits from Start of Care: 7  Current functional level related to goals / functional outcomes: See below   Remaining deficits: See below   Education / Equipment: HEP  Plan:  Patient goals were partially met. Patient is being discharged due to not returning since last visit. David Livingston, PT, DPT 02/05/22 8:35 AM       Patient Name: David Livingston MRN: [id number] DOB:1955/08/30, 66 y.o., male Today's Date: 12/16/2021  Progress Note Reporting Period 11/25/2021 to 12/16/2021  See note below for Objective Data and Assessment of Progress/Goals.    END OF SESSION:   PT End of Session - 12/16/21 0806     Visit Number 7    Number of Visits 18    Date for PT Re-Evaluation 01/20/22    Progress Note Due on Visit 17    PT Start Time 0800    PT Stop Time 0850    PT Time Calculation (min) 50 min    Activity Tolerance Patient tolerated treatment well    Behavior During Therapy [facility] for tasks assessed/performed              Past Medical History:  Diagnosis Date   Anginal pain ([place])    Arthritis    Coronary artery disease    Hepatitis    Hypertension    Sleep apnea    Past Surgical History:  Procedure Laterality Date   CARDIAC CATHETERIZATION     ~ 2005 stent to D1; 10/30/10: DES dRCA with 0% residual stenosis & cutting balloon angioplasty D1 for ISR with 70% stenosis reduced to 30% ([place], [place])   JOINT REPLACEMENT     partial right knee replacement   TONSILLECTOMY     TOTAL KNEE ARTHROPLASTY Left 11/04/2021   Procedure: LEFT TOTAL KNEE ARTHROPLASTY;  Surgeon: Leandrew Koyanagi, MD;  Location: [place];  Service: Orthopedics;  Laterality: Left;   Patient Active Problem List   Diagnosis Date Noted   Status post total left knee replacement 11/04/2021   Primary osteoarthritis of left knee 08/31/2021    THERAPY DIAG:  Other abnormalities of gait  and mobility  Acute pain of left knee  Muscle weakness (generalized)  Stiffness of left knee, not elsewhere classified  Localized edema  PCP: Kelvin Cellar, MD   REFERRING PROVIDER: Leandrew Koyanagi, MD   REFERRING DIAG: [phone] (ICD-10-CM) - Hx of total knee replacement, left   THERAPY DIAG:  Acute pain of left knee   Stiffness of left knee, not elsewhere classified   Muscle weakness (generalized)   Other abnormalities of gait and mobility   Localized edema   ONSET DATE: Lt TKA 11/04/21   SUBJECTIVE:    SUBJECTIVE STATEMENT: Pt indicated tightness in morning but no real consistent pain to report.   PERTINENT HISTORY: Lt TKA 11/04/21, CAD, HTN, sleep apnea, partial rt knee replacement   PAIN:  Are you having pain? No pain upon arrival, just tightness   PRECAUTIONS: None   WEIGHT BEARING RESTRICTIONS No   FALLS:  Has patient fallen in last 6 months? No   OCCUPATION: sales   PLOF: Independent   PATIENT GOALS reduce pain and get more ROM     OBJECTIVE:     PATIENT SURVEYS:  12/16/2021:  FOTO update 71%  11/25/2021 FOTO 51% functional, goal is 66%   MUSCLE LENGTH: 11/25/2021 Tight hamstrings and quads on Left   POSTURE:  11/25/2021 WFL but  decreased weight shift to left   LE ROM:   AROM/PROM Right 11/25/2021 Left 11/25/2021 Left 12/03/2021 Left 12/06/2021 Left 12/12/21 Left 12/16/2021  Hip flexion          Hip extension          Hip abduction          Hip adduction          Hip internal rotation          Hip external rotation          Knee flexion 124/ 90/100 102/105 AROM in supine heel slide: 104 A: 108 P:  113 In supine heelslide AROM: 105  Knee extension 0/ 2/0 2/0   -2 in seated LAQ  Ankle dorsiflexion          Ankle plantarflexion          Ankle inversion          Ankle eversion           (Blank rows = not tested)   LE MMT:   MMT in sitting Left 11/25/2021 Right 12/16/2021 Left  12/16/2021  Hip flexion 4+    Hip extension      Hip  abduction 4+    Hip adduction      Hip internal rotation      Hip external rotation      Knee flexion 4    Knee extension 4+ 5/5 60, 61.8 lbs 5/5 35.6, .35.2 lbs  Ankle dorsiflexion      Ankle plantarflexion      Ankle inversion      Ankle eversion       (Blank rows = not tested)     FUNCTIONAL TESTS:  11/25/21 5 times sit to stand: 10 seconds without UE support but decreased weight shift to left   GAIT: 12/16/2021:  Independent ambulation to and from clinic  11/25/2021: Distance walked: 50 Assistive device utilized: came in with single walking stick but can ambulate short distance without AD Level of assistance: Modified independence Comments: slower velocity, mild antalgic gait with decreased weight shift to left       TODAY'S TREATMENT: 12/16/2021: Therapeutic Exercises: Recumbent bike Seat 9 for 8 minute AAROM, partial revolutions progressing to full  Runner stretch incline board Lt leg posterior 30 sec x 3  Supine AROM heel slide Lt x 5 Machine LAQ double leg up, eccentric lowering on Lt 10 lbs 2 x 10  Machine Hamstring curl Lt leg only 2 x 10 15 lbs  Therapeutic Activity Leg Press: Lt LE only 50 lbs 2 x 15  rest breaks in flexion  Step on and over/down WB on Lt leg c one hand rail assist 2 x 15 - 6 inch step  Modalities: Vasopneumatic X 10 minutes L knee Medium compression 34 degrees  12/12/21 Therex:      Aerobic: Recumbent bike seat 9, then 8, partial to full revolutions x 4 min; L4 x 4 min     Machines: Bil leg press 93# 2x15; then LLE only 43# 2x15     Standing: Gastroc stretch 3x30 sec on incline board     Supine: AA heel slide x 10 reps; with 5 sec hold     Sitting: Lt LAQ 3x10 with 5#; 5 sec hold  Modalities: Vaso x 10 min; med pressure; 34 deg; Lt knee   12/06/2021: Therapeutic Exercises: Recumbent bike Seat 9 for 8 minute AAROM, partial revolutions progressing to full  Seated straight leg raises 3  sets of 5 for 3 seconds with 2# Incline board  standing calf stretch 30 sec x 3  Supine AROM heel slide Lt x 10 Seated LAQ 5 lbs 2 x 15  Therapeutic Activity Leg Press: 93 lbs x 15 bilateral LE Leg Press: Lt LE only 43 lbs 2 x 15  rest breaks in flexion stretch 30 sec after each set Step on and over/down WB on Lt leg c one hand rail assist x 15  Manual:   Seated Lt knee flexion c IR/distraction mobilization c movement Modalities: Vasopneumatic X 10 minutes L knee Medium compression 34 degrees  12/03/2021: Therapeutic Exercises: Recumbent bike Seat 9 for 8 minute AAROM, partial revolutions progressing to full  Step ups on 6 inch step with no UE support  Lateral step ups 6 inch step with single UE support Seated straight leg raises 3 sets of 5 for 3 seconds with 2# Leg Press: 87# 3 x 10 bilateral LE Leg Press: Lt LE only 31# 2  x 10 Supine SLR with 5# ankle weight x 15  Manual:   Contract/Relax with contralateral LE performing opposite motion x 10 PROM in flexion and extension of Lt knee  Modalities: Vasopneumatic X 10 minutes L knee Medium compression 34 degrees    PATIENT EDUCATION:  11/25/2021 Education details: HEP,POC Person educated: Patient Education method: Consulting civil engineer, Media planner, Verbal cues, and Handouts Education comprehension: verbalized understanding, returned demonstration, and needs further education     HOME EXERCISE PROGRAM: 11/25/2021 Access Code: [email or web address] URL: [email or web address] Date: 11/25/2021 Prepared by: David Livingston   Exercises - standing knee flexion stretch on chair or step  - 2 x daily - 6 x weekly - 1 sets - 10 reps - 10 hold - Supine Heel Slide with Strap  - 2 x daily - 6 x weekly - 1-2 sets - 10 reps - 5 hold - Supine Quadriceps Stretch with Strap on Table  - 2 x daily - 6 x weekly - 2-3 reps - 30 hold - Seated Straight Leg Raise with Quad Contraction  - 2 x daily - 6 x weekly - 2-3 sets - 10 reps - Squat with Counter Support  - 1 x daily - 7 x weekly - 1-2 sets - 10  reps - 5 hold     ASSESSMENT:   CLINICAL IMPRESSION:  Pt has attended 7 visits overall during course of treatment.  See objective data for updated information showing improvements in all areas.  FOTO improved greatly compared to evaluation level.  Contiued skilled PT services indicated at this time to continue to gain to full goal reaching and improve quad strength within 15-20% of Rt side.     OBJECTIVE IMPAIRMENTS: decreased activity tolerance, difficulty walking, decreased balance, decreased endurance, decreased mobility, decreased ROM, decreased strength, impaired flexibility, impaired LE use, postural dysfunction, and pain.   ACTIVITY LIMITATIONS: bending, lifting, carry, locomotion, cleaning, community activity, driving, and or occupation   PERSONAL FACTORS: Lt TKR 11/04/21, CAD, HTN, sleep apnea, partial rt knee replacement are also affecting patient's functional outcome.   REHAB POTENTIAL: Good   CLINICAL DECISION MAKING: Stable/uncomplicated   EVALUATION COMPLEXITY: Low       GOALS: Short term PT Goals Target date: 12/23/2021 Pt will be I and compliant with HEP. Baseline:  Goal status: Met 11/28/2021 Pt will decrease pain by 25% overall Baseline: Goal status: Met, pain is minimal 11/28/2021   Long term PT goals Target date: 01/20/2022 Pt will improve ROM to [facility] to improve functional mobility Baseline:  Goal status: on-going 12/16/2021 Pt will improve  hip/knee strength to at least 5-/5 MMT to improve functional strength Baseline: Goal status: MET 12/16/2021 Pt will improve FOTO to at least 66% functional to show improved function Baseline: Goal status: MET 12/16/2021 Pt will reduce pain by overall 50% overall with usual activity Baseline: Goal status:  MET 12/16/2021 Pt will reduce pain to overall less than 2-3/10 with usual activity and work activity. Baseline: Goal status: On-going 12/16/2021 Pt will be able to ambulate community distances at least 1000 ft WNL gait pattern  without complaints Baseline: Goal status: MET 12/16/2021   PLAN: PT FREQUENCY: 1-2 times per week    PT DURATION: 6-8 weeks   PLANNED INTERVENTIONS (unless contraindicated): aquatic PT, Canalith repositioning, cryotherapy, Electrical stimulation, Iontophoresis with 4 mg/ml dexamethasome, Moist heat, traction, Ultrasound, gait training, Therapeutic exercise, balance training, neuromuscular re-education, patient/family education, prosthetic training, manual techniques, passive ROM, dry needling, taping, vasopnuematic device, vestibular, spinal manipulations, joint manipulations   PLAN FOR NEXT SESSION:  Quad strengthening, functional WB control improvements, Flexion improvements     Scot Jun, PT, DPT, OCS, ATC 12/16/21  8:41 AM

## 2021-12-17 ENCOUNTER — Ambulatory Visit (INDEPENDENT_AMBULATORY_CARE_PROVIDER_SITE_OTHER): Payer: BC Managed Care – PPO

## 2021-12-17 ENCOUNTER — Ambulatory Visit (INDEPENDENT_AMBULATORY_CARE_PROVIDER_SITE_OTHER): Payer: BC Managed Care – PPO | Admitting: Orthopaedic Surgery

## 2021-12-17 DIAGNOSIS — Z96652 Presence of left artificial knee joint: Secondary | ICD-10-CM

## 2021-12-17 DIAGNOSIS — M25562 Pain in left knee: Secondary | ICD-10-CM

## 2021-12-17 NOTE — Progress Notes (Signed)
? ?  Post-Op Visit Note ?  ?Patient: David Livingston           ?Date of Birth: 01-Oct-1955           ?MRN: [id number] ?Visit Date: 12/17/2021 ?PCP: Kelvin Cellar, MD ? ? ?Assessment & Plan: ? ?Chief Complaint:  ?Chief Complaint  ?Patient presents with  ? Left Knee - Pain  ? ?Visit Diagnoses:  ?1. Status post total left knee replacement   ? ? ?Plan: David Livingston is 6 weeks status post left total knee replacement.  He mainly has complaints of swelling and stiffness throughout the day.  He has progressed very well through physical therapy and finished his last session yesterday.  He is able to make a full revolution on recumbent bike.  Works from home. ? ?Examination of left knee shows a fully healed surgical scar.  Range of motion is 0 to 113 degrees.  Stable to varus valgus. ? ?David Livingston is doing very well from his knee replacement and x-rays show stable implant.  Activity wise he is also doing very well.  We can see him back in another 6 weeks for two-view x-rays of the left knee.  Dental prophylaxis reinforced. ? ?Follow-Up Instructions: Return in about 6 weeks (around 01/28/2022).  ? ?Orders:  ?Orders Placed This Encounter  ?Procedures  ? XR Knee 1-2 Views Left  ? ?No orders of the defined types were placed in this encounter. ? ? ?Imaging: ?XR Knee 1-2 Views Left ? ?Result Date: 12/17/2021 ?Stable total knee replacement in good alignment.   ? ?PMFS History: ?Patient Active Problem List  ? Diagnosis Date Noted  ? Status post total left knee replacement 11/04/2021  ? Primary osteoarthritis of left knee 08/31/2021  ? ?Past Medical History:  ?Diagnosis Date  ? Anginal pain ([place])   ? Arthritis   ? Coronary artery disease   ? Hepatitis   ? Hypertension   ? Sleep apnea   ?  ?No family history on file.  ?Past Surgical History:  ?Procedure Laterality Date  ? CARDIAC CATHETERIZATION    ? ~ 2005 stent to D1; 10/30/10: DES dRCA with 0% residual stenosis & cutting balloon angioplasty D1 for ISR with 70% stenosis reduced to 30% ([place], [place])  ? JOINT REPLACEMENT    ? partial right knee replacement  ? TONSILLECTOMY    ? TOTAL KNEE ARTHROPLASTY Left 11/04/2021  ? Procedure: LEFT TOTAL KNEE ARTHROPLASTY;  Surgeon: Leandrew Koyanagi, MD;  Location: [place];  Service: Orthopedics;  Laterality: Left;  ? ?Social History  ? ?Occupational History  ? Not on file  ?Tobacco Use  ? Smoking status: Every Day  ?  Packs/day: 0.25  ?  Types: Cigarettes  ? Smokeless tobacco: Never  ?Substance and Sexual Activity  ? Alcohol use: Yes  ?  Alcohol/week: 10.0 standard drinks  ?  Types: 10 Shots of liquor per week  ? Drug use: Never  ? Sexual activity: Not on file  ? ? ? ?

## 2021-12-18 ENCOUNTER — Encounter: Payer: Self-pay | Admitting: Orthopaedic Surgery

## 2022-01-06 ENCOUNTER — Emergency Department (HOSPITAL_COMMUNITY)
Admission: EM | Admit: 2022-01-06 | Discharge: 2022-01-06 | Disposition: A | Discharge status: Home / Self Care | Payer: BC Managed Care – PPO | Attending: Emergency Medicine | Admitting: Emergency Medicine

## 2022-01-06 ENCOUNTER — Other Ambulatory Visit: Payer: Self-pay

## 2022-01-06 DIAGNOSIS — S3992XA Unspecified injury of lower back, initial encounter: Secondary | ICD-10-CM | POA: Diagnosis present

## 2022-01-06 DIAGNOSIS — X58XXXA Exposure to other specified factors, initial encounter: Secondary | ICD-10-CM | POA: Insufficient documentation

## 2022-01-06 DIAGNOSIS — M62830 Muscle spasm of back: Secondary | ICD-10-CM | POA: Diagnosis not present

## 2022-01-06 DIAGNOSIS — S39012A Strain of muscle, fascia and tendon of lower back, initial encounter: Secondary | ICD-10-CM | POA: Insufficient documentation

## 2022-01-06 DIAGNOSIS — Z96652 Presence of left artificial knee joint: Secondary | ICD-10-CM | POA: Diagnosis not present

## 2022-01-06 DIAGNOSIS — Z7982 Long term (current) use of aspirin: Secondary | ICD-10-CM | POA: Diagnosis not present

## 2022-01-06 MED ORDER — LIDOCAINE 5 % EX PTCH: 1.0000 | MEDICATED_PATCH | CUTANEOUS | 0 refills | Status: AC

## 2022-01-06 MED ORDER — METHYLPREDNISOLONE 4 MG PO TBPK: ORAL_TABLET | ORAL | 0 refills | Status: DC

## 2022-01-06 MED ORDER — CYCLOBENZAPRINE HCL 10 MG PO TABS: 10.0000 mg | ORAL_TABLET | Freq: Two times a day (BID) | ORAL | 0 refills | Status: AC | PRN

## 2022-01-06 MED ORDER — DIAZEPAM 2 MG PO TABS
2.0000 mg | ORAL_TABLET | Freq: Once | ORAL | Status: AC
  Administered 2022-01-06: 2 mg via ORAL
  Filled 2022-01-06: qty 1

## 2022-01-06 NOTE — ED Provider Notes (Signed)
[facility]   CSN: [id number] Arrival date & time: 01/06/22  1055     History  Chief Complaint  Patient presents with   Back Pain    David Livingston is a 66 y.o. male.  Here with lower back pain since yesterday.  Has tried Vicodin and Robaxin without much relief.  Pain is worse with movement.  He denies any loss of bowel or bladder.  No weakness or numbness.  Pain does not radiate.  Recent left knee replacement and has been using a walker to ambulate.  Thinks that might be what caused him to throw his back out.  Denies any fever, chills, weakness, numbness, chest pain, shortness of breath.  The history is provided by the patient.      Home Medications Prior to Admission medications   Medication Sig Start Date End Date Taking? Authorizing Provider  cyclobenzaprine (FLEXERIL) 10 MG tablet Take 1 tablet (10 mg total) by mouth 2 (two) times daily as needed for muscle spasms. 01/06/22  Yes Wiliam Cauthorn, DO  docusate sodium (COLACE) 100 MG capsule Take 1 capsule (100 mg total) by mouth daily as needed. 10/28/21 10/28/22  Cristie HemStanbery, Mary L, PA-C  lidocaine (LIDODERM) 5 % Place 1 patch onto the skin daily. Remove & Discard patch within 12 hours or as directed by MD 01/06/22  Yes Lockie Molauratolo, Jasdeep Dejarnett, DO  methylPREDNISolone (MEDROL DOSEPAK) 4 MG TBPK tablet Follow package insert 01/06/22  Yes Vidhi Delellis, DO  ondansetron (ZOFRAN) 4 MG tablet Take 1 tablet (4 mg total) by mouth every 8 (eight) hours as needed for nausea or vomiting. 10/28/21   Cristie HemStanbery, Mary L, PA-C  amLODipine (NORVASC) 5 MG tablet Take 5 mg by mouth daily. 08/21/21   [provider]  aspirin EC 81 MG tablet Take 1 tablet (81 mg total) by mouth 2 (two) times daily. To be taken after surgery to prevent blood clots 10/28/21   Cristie HemStanbery, Mary L, PA-C  benazepril (LOTENSIN) 20 MG tablet Take 20 mg by mouth daily. 08/21/21   [provider]  cholecalciferol (VITAMIN D3) 25  MCG (1000 UNIT) tablet Take 1,000 Units by mouth daily.    [provider]  ezetimibe (ZETIA) 10 MG tablet Take 10 mg by mouth daily. 08/21/21   [provider]  fluticasone (FLONASE) 50 MCG/ACT nasal spray Place 1 spray into both nostrils daily as needed for allergies or rhinitis.    [provider]  Multiple Vitamin (MULTIVITAMIN WITH MINERALS) TABS tablet Take 1 tablet by mouth daily.    [provider]  oxyCODONE-acetaminophen (PERCOCET) 5-325 MG tablet Take 1-2 tablets by mouth every 6 (six) hours as needed. To be taken after surgery 11/18/21   Cristie HemStanbery, Mary L, PA-C  simvastatin (ZOCOR) 40 MG tablet Take 40 mg by mouth at bedtime. 08/21/21   [provider]      Allergies    Patient has no known allergies.    Review of Systems   Review of Systems  Physical Exam Updated Vital Signs BP 132/67 (BP Location: Left Arm)   Pulse 73   Temp 98.5 F (36.9 C) (Oral)   Resp 16   SpO2 100%  Physical Exam Vitals and nursing note reviewed.  Constitutional:      General: He is not in acute distress.    Appearance: He is well-developed. He is not ill-appearing.  HENT:     Head: Normocephalic and atraumatic.     Nose: Nose normal.  Mouth/Throat:     Mouth: Mucous membranes are moist.  Eyes:     Extraocular Movements: Extraocular movements intact.     Conjunctiva/sclera: Conjunctivae normal.     Pupils: Pupils are equal, round, and reactive to light.  Cardiovascular:     Rate and Rhythm: Normal rate and regular rhythm.     Pulses: Normal pulses.     Heart sounds: Normal heart sounds. No murmur heard. Pulmonary:     Effort: Pulmonary effort is normal. No respiratory distress.     Breath sounds: Normal breath sounds.  Abdominal:     Palpations: Abdomen is soft.     Tenderness: There is no abdominal tenderness.  Musculoskeletal:        General: Tenderness present. No swelling. Normal range of motion.     Cervical back: Normal range of  motion and neck supple.     Comments: Tenderness to paraspinal lumbar muscles bilaterally  Skin:    General: Skin is warm and dry.     Capillary Refill: Capillary refill takes less than 2 seconds.  Neurological:     General: No focal deficit present.     Mental Status: He is alert and oriented to person, place, and time.     Cranial Nerves: No cranial nerve deficit.     Sensory: No sensory deficit.     Motor: No weakness.     Coordination: Coordination normal.  Psychiatric:        Mood and Affect: Mood normal.    ED Results / Procedures / Treatments   Labs (all labs ordered are listed, but only abnormal results are displayed) Labs Reviewed - No data to display  EKG None  Radiology No results found.  Procedures Procedures    Medications Ordered in ED Medications  diazepam (VALIUM) tablet 2 mg (2 mg Oral Given 01/06/22 1233)    ED Course/ Medical Decision Making/ A&P                           Medical Decision Making Risk Prescription drug management.   David Livingston is here with back pain.  Recent left knee replacement.  Felt his back go out yesterday.  Denies any loss of bowel or bladder.  Denies any weakness or numbness.  Pain is in his bilateral lower back.  Denies any abdominal pain, chest pain, shortness of breath.  Has tried Robaxin and Vicodin with minimal relief.  On exam he is tenderness to the paraspinal lumbar muscles bilaterally.  He has normal pulses in his lower extremities.  Overall suspect that he has a back spasm.  I have no concern for cauda equina or peripheral arterial disease or DVT.  He has no abdominal pain and no concern for dissection.  Overall he appears well.  Was given a dose of Valium with improvement.  Will prescribe Flexeril, Medrol Dosepak and recommend continued use of Tylenol and lidocaine patches.  Understands return precautions and discharged in ED in good condition.  This chart was dictated using voice recognition software.  Despite best  efforts to proofread,  errors can occur which can change the documentation meaning.         Final Clinical Impression(s) / ED Diagnoses Final diagnoses:  Muscle spasm of back  Strain of lumbar region, initial encounter    Rx / DC Orders ED Discharge Orders          Ordered    lidocaine (LIDODERM) 5 %  Every 24  hours        01/06/22 1317    cyclobenzaprine (FLEXERIL) 10 MG tablet  2 times daily PRN        01/06/22 1317    methylPREDNISolone (MEDROL DOSEPAK) 4 MG TBPK tablet        01/06/22 1317              Nyasia Baxley, DO 01/06/22 1319

## 2022-01-06 NOTE — ED Triage Notes (Signed)
Pt woke up with lower back pain yesterday morning. Has taken a Vicodin and muscle relaxer without relief. Pain worse with movement. Denies urinary symptoms or incontinence.

## 2022-01-06 NOTE — Discharge Instructions (Signed)
Take medications as prescribed.  In addition make sure you are taking Tylenol, you can take up to 4000 mg of Tylenol a day.

## 2022-11-18 ENCOUNTER — Encounter: Payer: Self-pay | Admitting: Orthopaedic Surgery

## 2022-11-25 ENCOUNTER — Other Ambulatory Visit (INDEPENDENT_AMBULATORY_CARE_PROVIDER_SITE_OTHER): Payer: Medicare Other

## 2022-11-25 ENCOUNTER — Ambulatory Visit (INDEPENDENT_AMBULATORY_CARE_PROVIDER_SITE_OTHER): Payer: Medicare Other | Admitting: Orthopaedic Surgery

## 2022-11-25 DIAGNOSIS — Z96652 Presence of left artificial knee joint: Secondary | ICD-10-CM | POA: Diagnosis not present

## 2022-11-25 NOTE — Progress Notes (Signed)
Office Visit Note   Patient: David Livingston           Date of Birth: 1956/02/29           MRN: [id number] Visit Date: 11/25/2022              Requested by: Jeralyn Bennett, MD [address] [place],  [place] [address] PCP: Jeralyn Bennett, MD   Assessment & Plan: Visit Diagnoses:  1. Status post total left knee replacement     Plan: Impression is a 67 year old gentleman 1 year status post left total knee replacement.  Overall he is happy with the outcome and the range of motion is functional and not limiting him in a significant way.  Dental prophylaxis was reinforced.  He will continue to be as active as he wants.  Recheck in another year with two-view x-rays of the left knee.  Follow-Up Instructions: Return in about 1 year (around 11/25/2023).   Orders:  Orders Placed This Encounter  Procedures   XR KNEE 3 VIEW LEFT   No orders of the defined types were placed in this encounter.     Procedures: No procedures performed   Clinical Data: No additional findings.   Subjective: Chief Complaint  Patient presents with   Left Knee - Follow-up    HPI  David Livingston is following up status post left total knee replacement about a year ago.  He is doing well physical therapy exercises at home.  His main challenges flexibility which improves with rest and deteriorates with increased activity which causes swelling.  Overall he is happy with the outcome and his leg is functional.  Review of Systems   Objective: Vital Signs: There were no vitals taken for this visit.  Physical Exam  Ortho Exam  Examination left knee shows fully healed surgical scar.  Good varus valgus stability.  Range of motion is about 2 to 100 degrees.  Specialty Comments:  No specialty comments available.  Imaging: XR KNEE 3 VIEW LEFT  Result Date: 11/25/2022 Stable left total knee replacement without complication    PMFS History: Patient Active Problem List   Diagnosis Date Noted   Status post  total left knee replacement 11/04/2021   Primary osteoarthritis of left knee 08/31/2021   Past Medical History:  Diagnosis Date   Anginal pain (HCC)    Arthritis    Coronary artery disease    Hepatitis    Hypertension    Sleep apnea     No family history on file.  Past Surgical History:  Procedure Laterality Date   CARDIAC CATHETERIZATION     ~ 2005 stent to D1; 10/30/10: DES dRCA with 0% residual stenosis & cutting balloon angioplasty D1 for ISR with 70% stenosis reduced to 30% ([facility] [place])   JOINT REPLACEMENT     partial right knee replacement   TONSILLECTOMY     TOTAL KNEE ARTHROPLASTY Left 11/04/2021   Procedure: LEFT TOTAL KNEE ARTHROPLASTY;  Surgeon: Tarry Kos, MD;  Location: [facility] OR;  Service: Orthopedics;  Laterality: Left;   Social History   Occupational History   Not on file  Tobacco Use   Smoking status: Every Day    Packs/day: .25    Types: Cigarettes   Smokeless tobacco: Never  Substance and Sexual Activity   Alcohol use: Yes    Alcohol/week: 10.0 standard drinks of alcohol    Types: 10 Shots of liquor per week   Drug use: Never   Sexual activity: Not  on file

## 2023-05-08 ENCOUNTER — Ambulatory Visit (INDEPENDENT_AMBULATORY_CARE_PROVIDER_SITE_OTHER): Payer: Medicare Other | Admitting: Physician Assistant

## 2023-05-08 ENCOUNTER — Other Ambulatory Visit (INDEPENDENT_AMBULATORY_CARE_PROVIDER_SITE_OTHER): Payer: Medicare Other

## 2023-05-08 DIAGNOSIS — M1711 Unilateral primary osteoarthritis, right knee: Secondary | ICD-10-CM

## 2023-05-08 MED ORDER — DICLOFENAC SODIUM 75 MG PO TBEC: 75.0000 mg | DELAYED_RELEASE_TABLET | Freq: Two times a day (BID) | ORAL | 2 refills | Status: DC | PRN

## 2023-05-08 NOTE — Progress Notes (Signed)
Office Visit Note   Patient: David Livingston           Date of Birth: [birth year]           MRN: [id number] Visit Date: 05/08/2023              Requested by: Jeralyn Bennett, MD [address] [place],  [place] [address] PCP: Jeralyn Bennett, MD   Assessment & Plan: Visit Diagnoses:  1. Unilateral primary osteoarthritis, right knee     Plan: Impression is right knee osteoarthritis.  Unfortunately, this patients arthritis has now progressed to the lateral and patellofemoral compartments.  Because of his underlying medial compartment replacement, do not feel comfortable injecting this with cortisone.  We have discussed trying oral anti-inflammatories and have sent in Voltaren.  The patient is asked about viscosupplementation injection.  I will reach out to Dr. Roda Shutters and see if he feels comfortable doing this with the underlying replacement.  I will also see if there are any other options he can think of as the patient is not ready to proceed with conversion to a total knee replacement.  Follow-Up Instructions: Return if symptoms worsen or fail to improve.   Orders:  Orders Placed This Encounter  Procedures   XR KNEE 3 VIEW RIGHT   Meds ordered this encounter  Medications   diclofenac (VOLTAREN) 75 MG EC tablet    Sig: Take 1 tablet (75 mg total) by mouth 2 (two) times daily as needed.    Dispense:  60 tablet    Refill:  2      Procedures: No procedures performed   Clinical Data: No additional findings.   Subjective: Chief Complaint  Patient presents with   Right Knee - Pain    HPI patient is a pleasant 67 year old gentleman who comes in today with right knee pain.  He is approximately 16 years status post medial compartment replacement and was doing well until recently.  He was chopping wood a few days prior to the onset of his pain.  All his pain is to the anterolateral aspect.  Symptoms occur when he goes from a seated to standing position as well as with walking and  flexion of the knee.  He does get some relief with rest.  He has tried over-the-counter pain medication without significant relief.  Review of Systems as detailed in HPI.  All others reviewed and are negative.   Objective: Vital Signs: There were no vitals taken for this visit.  Physical Exam well-developed well-nourished gentleman in no acute distress.  Alert and oriented x 3.  Ortho Exam right knee exam: Small effusion.  Range of motion 0 to 125 degrees.  No joint line tenderness.  Ligaments are stable.  He is neurovascular intact distally.  Specialty Comments:  No specialty comments available.  Imaging: XR KNEE 3 VIEW RIGHT  Result Date: 05/08/2023 X-rays demonstrate previous medial compartment replacement.  He has advanced degenerative changes to the lateral and patellofemoral compartments    PMFS History: Patient Active Problem List   Diagnosis Date Noted   Status post total left knee replacement 11/04/2021   Primary osteoarthritis of left knee 08/31/2021   Past Medical History:  Diagnosis Date   Anginal pain (HCC)    Arthritis    Coronary artery disease    Hepatitis    Hypertension    Sleep apnea     No family history on file.  Past Surgical History:  Procedure Laterality Date   CARDIAC CATHETERIZATION     ~  2005 stent to D1; 10/30/10: DES dRCA with 0% residual stenosis & cutting balloon angioplasty D1 for ISR with 70% stenosis reduced to 30% ([facility] [place])   JOINT REPLACEMENT     partial right knee replacement   TONSILLECTOMY     TOTAL KNEE ARTHROPLASTY Left 11/04/2021   Procedure: LEFT TOTAL KNEE ARTHROPLASTY;  Surgeon: Tarry Kos, MD;  Location: [facility] OR;  Service: Orthopedics;  Laterality: Left;   Social History   Occupational History   Not on file  Tobacco Use   Smoking status: Every Day    Current packs/day: 0.25    Types: Cigarettes   Smokeless tobacco: Never  Substance and Sexual Activity   Alcohol use: Yes    Alcohol/week: 10.0  standard drinks of alcohol    Types: 10 Shots of liquor per week   Drug use: Never   Sexual activity: Not on file

## 2023-05-12 ENCOUNTER — Telehealth: Payer: Self-pay

## 2023-05-12 NOTE — Telephone Encounter (Signed)
Please precert for right knee gel injection. Dr.Xu's patient.  

## 2023-05-13 NOTE — Telephone Encounter (Signed)
VOB submitted for Monovisc, right knee  

## 2023-09-17 IMAGING — DX DG KNEE 1-2V PORT*L*
1 series · 2 of 2 positions shown · non-contrast
Comparison: Radiograph 08/30/2021

CLINICAL DATA: Postop near put

EXAM:
PORTABLE LEFT KNEE - 1-2 VIEW

[Series 1: knee · 0.14mm/px · 2 of 2 slices shown]
[im 1/2]
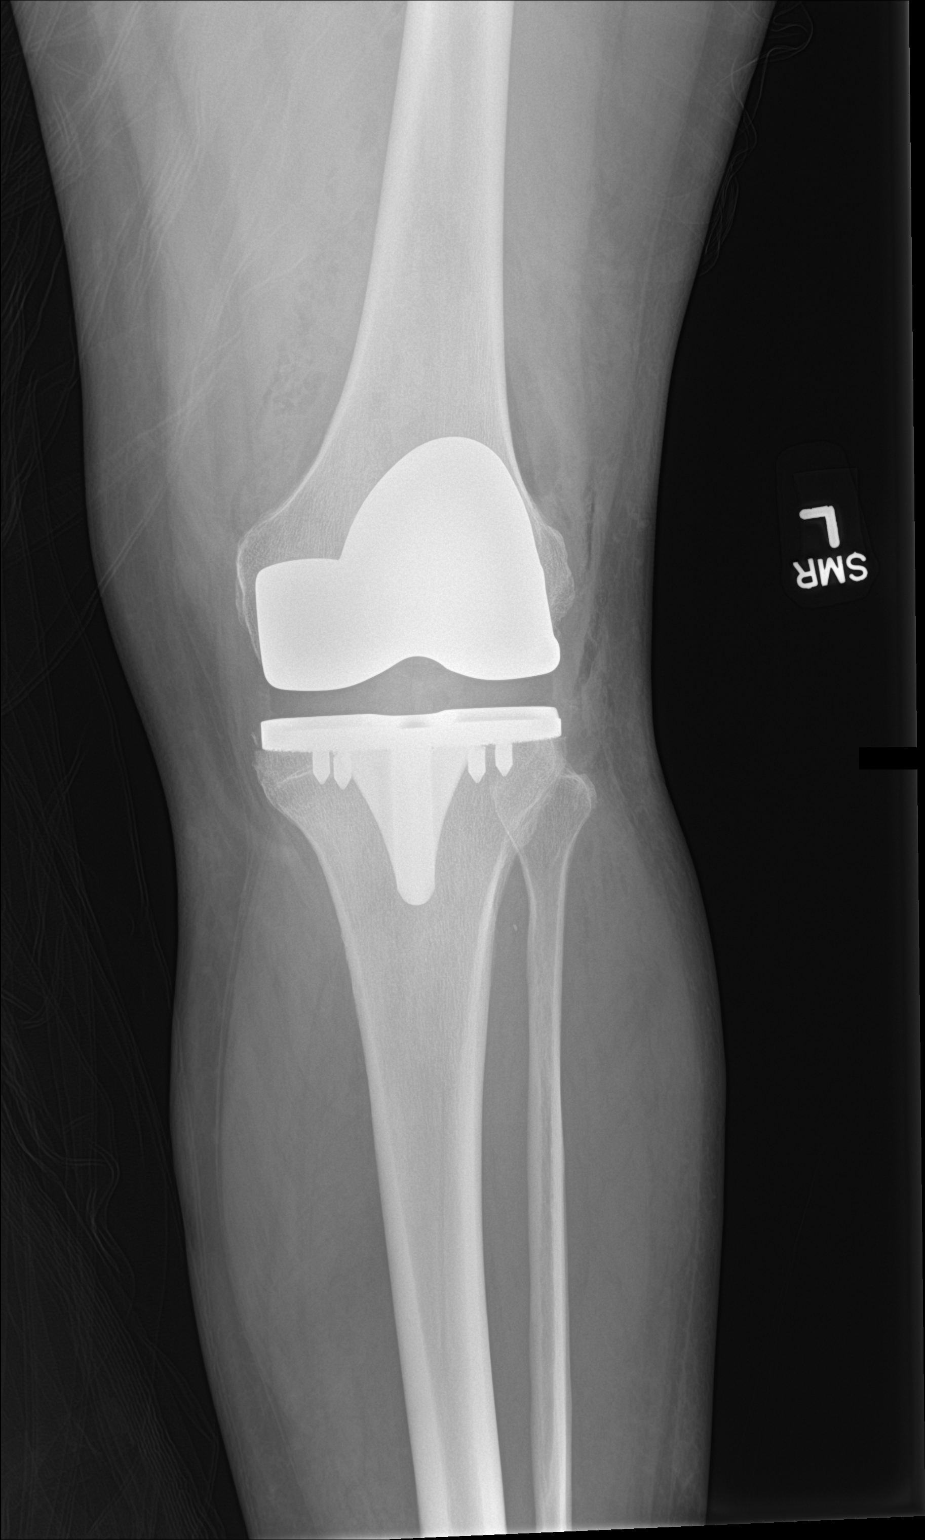
[im 2/2]
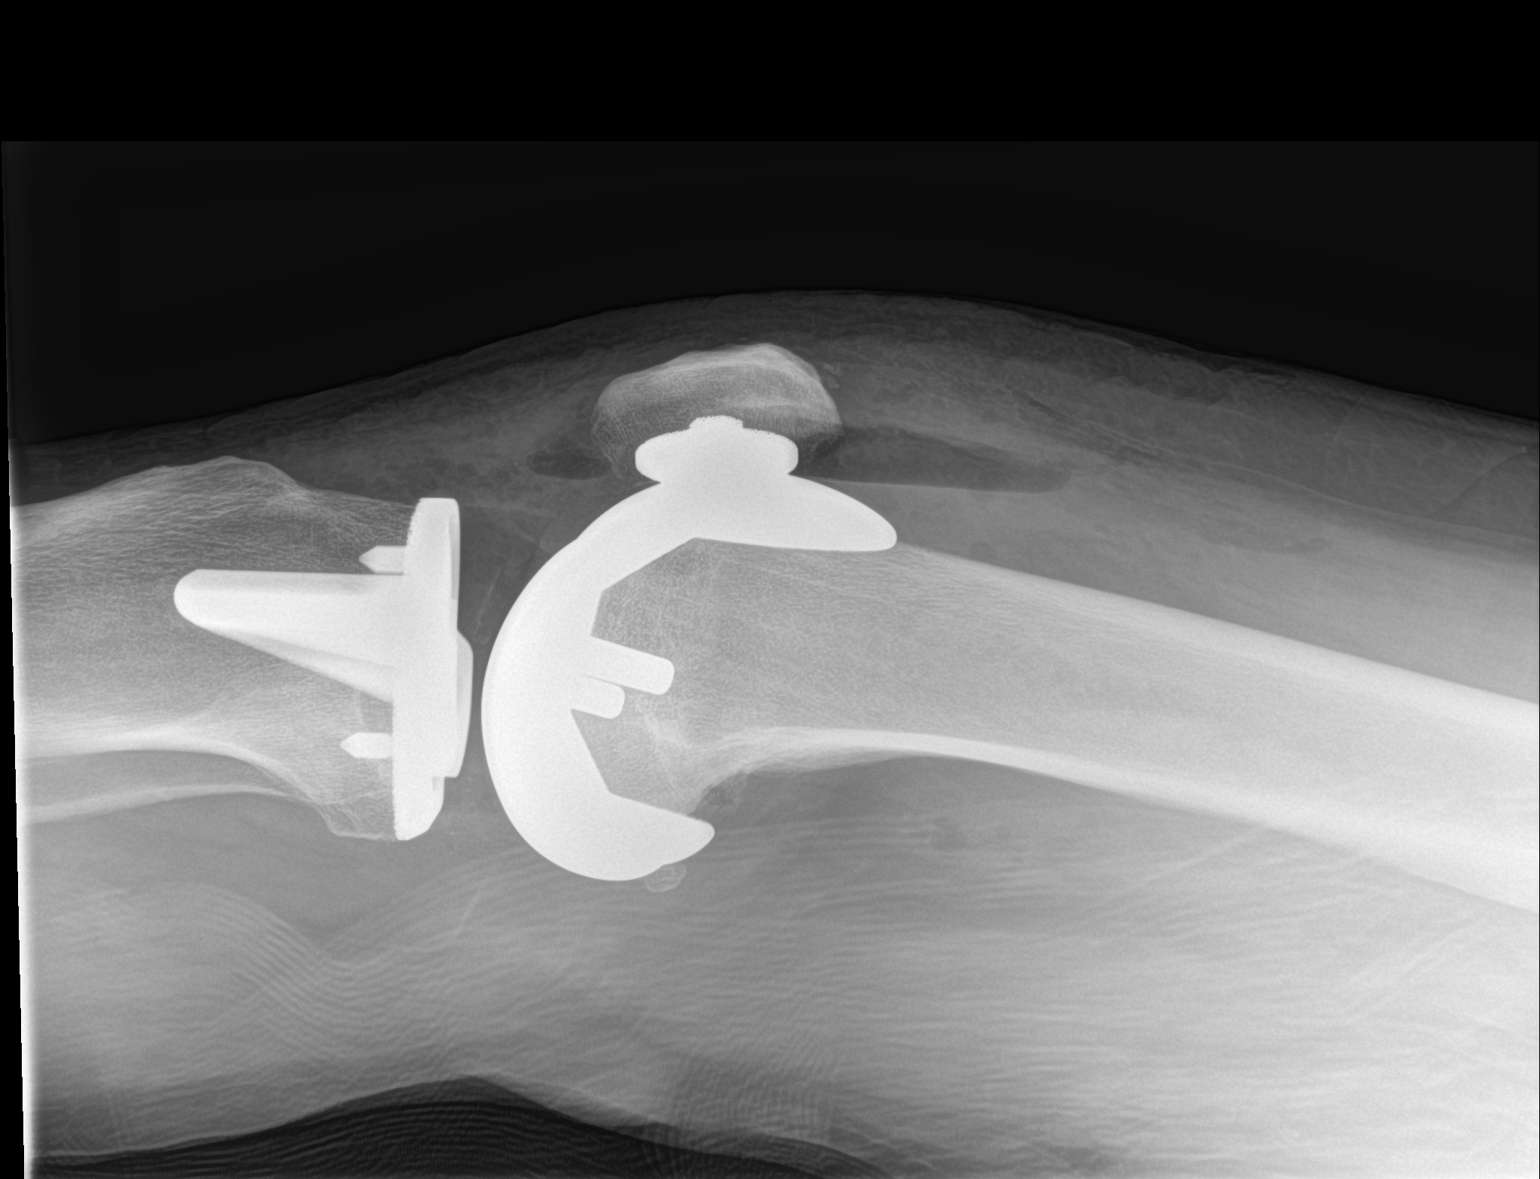

[2 of 2 positions shown; findings below may reference images not displayed]

FINDINGS: Total knee arthroplasty is in normal alignment out evidence of
loosening or periprosthetic fracture. Expected soft tissue changes.
IMPRESSION: Left knee arthroplasty without evidence of immediate hardware
complication.

## 2023-11-10 ENCOUNTER — Other Ambulatory Visit: Payer: Self-pay

## 2023-11-10 ENCOUNTER — Encounter (HOSPITAL_BASED_OUTPATIENT_CLINIC_OR_DEPARTMENT_OTHER): Payer: Self-pay | Admitting: Emergency Medicine

## 2023-11-10 ENCOUNTER — Encounter: Payer: Self-pay | Admitting: Orthopaedic Surgery

## 2023-11-10 ENCOUNTER — Emergency Department (HOSPITAL_BASED_OUTPATIENT_CLINIC_OR_DEPARTMENT_OTHER): Admitting: Radiology

## 2023-11-10 ENCOUNTER — Emergency Department (HOSPITAL_BASED_OUTPATIENT_CLINIC_OR_DEPARTMENT_OTHER)
Admission: EM | Admit: 2023-11-10 | Discharge: 2023-11-10 | Disposition: A | Discharge status: Home / Self Care | Attending: Emergency Medicine | Admitting: Emergency Medicine

## 2023-11-10 DIAGNOSIS — Z7982 Long term (current) use of aspirin: Secondary | ICD-10-CM | POA: Insufficient documentation

## 2023-11-10 DIAGNOSIS — M542 Cervicalgia: Secondary | ICD-10-CM | POA: Diagnosis present

## 2023-11-10 MED ORDER — KETOROLAC TROMETHAMINE 15 MG/ML IJ SOLN
15.0000 mg | Freq: Once | INTRAMUSCULAR | Status: AC
  Administered 2023-11-10: 15 mg via INTRAMUSCULAR
  Filled 2023-11-10: qty 1

## 2023-11-10 MED ORDER — METHYLPREDNISOLONE 4 MG PO TBPK: ORAL_TABLET | ORAL | 0 refills | Status: AC

## 2023-11-10 MED ORDER — HYDROCODONE-ACETAMINOPHEN 5-325 MG PO TABS
1.0000 | ORAL_TABLET | ORAL | Status: AC
  Administered 2023-11-10: 1 via ORAL
  Filled 2023-11-10: qty 1

## 2023-11-10 MED ORDER — OMEPRAZOLE 20 MG PO CPDR: 20.0000 mg | DELAYED_RELEASE_CAPSULE | Freq: Every day | ORAL | 0 refills | Status: AC

## 2023-11-10 MED ORDER — LIDOCAINE 5 % EX PTCH
1.0000 | MEDICATED_PATCH | CUTANEOUS | Status: DC
  Administered 2023-11-10: 1 via TRANSDERMAL
  Filled 2023-11-10: qty 1

## 2023-11-10 MED ORDER — METHYLPREDNISOLONE SODIUM SUCC 40 MG IJ SOLR
40.0000 mg | INTRAMUSCULAR | Status: AC
  Administered 2023-11-10: 40 mg via INTRAMUSCULAR
  Filled 2023-11-10: qty 1

## 2023-11-10 NOTE — ED Triage Notes (Signed)
 Neck pain Worse pain over the last 4 days Wearing soft neck collar, from home Hx of neck injury neck pain Was given injections about 5 years ago and has been without pain until this episode.

## 2023-11-10 NOTE — ED Provider Notes (Signed)
 [place] EMERGENCY DEPARTMENT AT [facility] Provider Note   CSN: [id number] Arrival date & time: 11/10/23  1614     History {Add pertinent medical, surgical, social history, OB history to HPI:1} Chief Complaint  Patient presents with   Neck Pain    David Livingston is a 68 y.o. male.  68 year old male with a history of remote neck injury presents emergency department neck pain.  Patient reports that when he was young man he suffered an injury to his neck.  Unsure exactly what it was.  Was seen for this and declined spinal fusion at that time.  Says that it had been doing well until 5 years ago when it flared up.  He got a neck injection in [place] and then reports that it resolved.  Says that over the past week has had progressive atraumatic pain in his neck.  Says that he currently rates it 7/10 in severity.  Midline.  Does not radiate down his arms at this time but has in the past.  Denies any bowel or bladder incontinence.  No weakness or numbness of his arms or legs.  No fevers.        Home Medications Prior to Admission medications   Medication Sig Start Date End Date Taking? Authorizing Provider  amLODipine (NORVASC) 5 MG tablet Take 5 mg by mouth daily. 08/21/21   [provider]  aspirin EC 81 MG tablet Take 1 tablet (81 mg total) by mouth 2 (two) times daily. To be taken after surgery to prevent blood clots 10/28/21   Cristie Hem, PA-C  benazepril (LOTENSIN) 20 MG tablet Take 20 mg by mouth daily. 08/21/21   [provider]  cholecalciferol (VITAMIN D3) 25 MCG (1000 UNIT) tablet Take 1,000 Units by mouth daily.    [provider]  cyclobenzaprine (FLEXERIL) 10 MG tablet Take 1 tablet (10 mg total) by mouth 2 (two) times daily as needed for muscle spasms. 01/06/22   Curatolo, Adam, DO  diclofenac (VOLTAREN) 75 MG EC tablet Take 1 tablet (75 mg total) by mouth 2 (two) times daily as needed. 05/08/23   Cristie Hem, PA-C  ezetimibe (ZETIA)  10 MG tablet Take 10 mg by mouth daily. 08/21/21   [provider]  fluticasone (FLONASE) 50 MCG/ACT nasal spray Place 1 spray into both nostrils daily as needed for allergies or rhinitis.    [provider]  lidocaine (LIDODERM) 5 % Place 1 patch onto the skin daily. Remove & Discard patch within 12 hours or as directed by MD 01/06/22   Virgina Norfolk, DO  methylPREDNISolone (MEDROL DOSEPAK) 4 MG TBPK tablet Follow package insert 01/06/22   Curatolo, Adam, DO  Multiple Vitamin (MULTIVITAMIN WITH MINERALS) TABS tablet Take 1 tablet by mouth daily.    [provider]  oxyCODONE-acetaminophen (PERCOCET) 5-325 MG tablet Take 1-2 tablets by mouth every 6 (six) hours as needed. To be taken after surgery 11/18/21   Cristie Hem, PA-C  simvastatin (ZOCOR) 40 MG tablet Take 40 mg by mouth at bedtime. 08/21/21   [provider]      Allergies    Patient has no known allergies.    Review of Systems   Review of Systems  Physical Exam Updated Vital Signs BP (!) 148/96 (BP Location: Right Arm)   Pulse 86   Temp 99 F (37.2 C)   Resp 16   SpO2 98%  Physical Exam Vitals and nursing note reviewed.  Constitutional:  General: He is not in acute distress.    Appearance: He is well-developed.  HENT:     Head: Normocephalic and atraumatic.     Right Ear: External ear normal.     Left Ear: External ear normal.     Nose: Nose normal.  Eyes:     Extraocular Movements: Extraocular movements intact.     Conjunctiva/sclera: Conjunctivae normal.     Pupils: Pupils are equal, round, and reactive to light.  Neck:     Comments: No midline or paraspinal C-spine tenderness to palpation Pulmonary:     Effort: Pulmonary effort is normal. No respiratory distress.  Musculoskeletal:     Cervical back: Normal range of motion and neck supple.     Comments: Motor: Muscle bulk and tone are normal. Strength is 5/5 in shoulder abduction, elbow flexion and extension, grip  strength, hip flexion, knee flexion and extension, ankle dorsiflexion and plantar flexion bilaterally. Full strength of great toe dorsiflexion bilaterally.  Sensory: Intact sensation to light touch in C5-T1 dermatomes and L2 though S1 dermatomes bilaterally.    Skin:    General: Skin is warm and dry.  Neurological:     Mental Status: He is alert. Mental status is at baseline.  Psychiatric:        Mood and Affect: Mood normal.        Behavior: Behavior normal.     ED Results / Procedures / Treatments   Labs (all labs ordered are listed, but only abnormal results are displayed) Labs Reviewed - No data to display  EKG None  Radiology No results found.  Procedures Procedures  {Document cardiac monitor, telemetry assessment procedure when appropriate:1}  Medications Ordered in ED Medications  lidocaine (LIDODERM) 5 % 1 patch (has no administration in time range)  ketorolac (TORADOL) 15 MG/ML injection 15 mg (has no administration in time range)  methylPREDNISolone sodium succinate (SOLU-MEDROL) 40 mg/mL injection 40 mg (has no administration in time range)  HYDROcodone-acetaminophen (NORCO/VICODIN) 5-325 MG per tablet 1 tablet (has no administration in time range)    ED Course/ Medical Decision Making/ A&P   {   Click here for ABCD2, HEART and other calculatorsREFRESH Note before signing :1}                              Medical Decision Making Amount and/or Complexity of Data Reviewed Radiology: ordered.  Risk Prescription drug management.   ***  {Document critical care time when appropriate:1} {Document review of labs and clinical decision tools ie heart score, Chads2Vasc2 etc:1}  {Document your independent review of radiology images, and any outside records:1} {Document your discussion with family members, caretakers, and with consultants:1} {Document social determinants of health affecting pt's care:1} {Document your decision making why or why not admission,  treatments were needed:1} Final Clinical Impression(s) / ED Diagnoses Final diagnoses:  None    Rx / DC Orders ED Discharge Orders     None

## 2023-11-10 NOTE — Discharge Instructions (Addendum)
 You were seen for your neck pain in the emergency department.   At home, please use over-the-counter Tylenol, ibuprofen, and lidocaine patches.  Take the Medrol Dosepak (steroid).  Neck pain as well.  Take the Prilosec that we have prescribed you while using ibuprofen and the Medrol Dosepak to prevent ulcers.  Follow-up with your primary doctor in 2-3 days regarding your visit.  Follow-up with the spine clinic as soon as possible regarding your symptoms.  Return immediately to the emergency department if you experience any of the following: Numbness or weakness of your arms or legs, bowel or bladder incontinence, numbness while wiping after pooping or urinating, or any other concerning symptoms.    Thank you for visiting our Emergency Department. It was a pleasure taking care of you today.

## 2023-11-12 ENCOUNTER — Encounter: Payer: Self-pay | Admitting: Physical Medicine and Rehabilitation

## 2023-11-12 ENCOUNTER — Ambulatory Visit (INDEPENDENT_AMBULATORY_CARE_PROVIDER_SITE_OTHER): Admitting: Physical Medicine and Rehabilitation

## 2023-11-12 DIAGNOSIS — S161XXA Strain of muscle, fascia and tendon at neck level, initial encounter: Secondary | ICD-10-CM

## 2023-11-12 DIAGNOSIS — M4802 Spinal stenosis, cervical region: Secondary | ICD-10-CM | POA: Diagnosis not present

## 2023-11-12 DIAGNOSIS — M542 Cervicalgia: Secondary | ICD-10-CM | POA: Diagnosis not present
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
## 2023-11-12 NOTE — Progress Notes (Signed)
 Pain Scale   Average Pain 0        +Driver, -BT, -Dye Allergies.

## 2024-05-05 ENCOUNTER — Ambulatory Visit (INDEPENDENT_AMBULATORY_CARE_PROVIDER_SITE_OTHER)

## 2024-05-05 ENCOUNTER — Encounter: Payer: Self-pay | Admitting: Cardiology

## 2024-05-05 ENCOUNTER — Ambulatory Visit: Attending: Cardiology | Admitting: Cardiology

## 2024-05-05 VITALS — BP 150/86 | HR 67 | Ht 69.0 in | Wt 229.3 lb

## 2024-05-05 DIAGNOSIS — H532 Diplopia: Secondary | ICD-10-CM | POA: Diagnosis present

## 2024-05-05 DIAGNOSIS — Z7689 Persons encountering health services in other specified circumstances: Secondary | ICD-10-CM | POA: Insufficient documentation

## 2024-05-05 DIAGNOSIS — E785 Hyperlipidemia, unspecified: Secondary | ICD-10-CM | POA: Insufficient documentation

## 2024-05-05 DIAGNOSIS — I10 Essential (primary) hypertension: Secondary | ICD-10-CM | POA: Diagnosis present

## 2024-05-05 DIAGNOSIS — Z720 Tobacco use: Secondary | ICD-10-CM | POA: Insufficient documentation

## 2024-05-05 DIAGNOSIS — I2510 Atherosclerotic heart disease of native coronary artery without angina pectoris: Secondary | ICD-10-CM | POA: Diagnosis present

## 2024-05-05 DIAGNOSIS — R002 Palpitations: Secondary | ICD-10-CM

## 2024-05-05 MED ORDER — ATORVASTATIN CALCIUM 40 MG PO TABS: 40.0000 mg | ORAL_TABLET | Freq: Every day | ORAL | 3 refills | Status: AC

## 2024-05-05 NOTE — Progress Notes (Signed)
 Cardiology Office Note:    Date:  05/05/2024   ID:  David Livingston, DOB [birth year], MRN [id number]  PCP:  Valdemar Anis, MD  Cardiologist:  Lonni LITTIE Nanas, MD  Electrophysiologist:  None   Referring MD: Valdemar Anis, MD   Chief Complaint  Patient presents with   Coronary Artery Disease    History of Present Illness:    David Livingston is a 68 y.o. male with a hx of CAD, hypertension, hyperlipidemia, OSA who is referred by Dr. Valdemar for evaluation of CAD.  Underwent PCI with stent to diagonal over 20 years ago and then stent to RCA 15 years ago.  He denies any recent chest pain.  Does report having some shortness of breath.  States that he will go for walks for 15 to 20 minutes, denies any exertional chest pain or dyspnea with this.  Does report some lightheadedness but denies any syncope.  Reports having palpitations, wakes him in the middle of the night.  He reports compliance with CPAP.  Reports having episodes of double vision.  He continues to smoke, about 0.5 packs/day.  Has smoked for over 50 years.  Family history includes paternal grandfather died of MI in 19s per  Echocardiogram 04/2023 showed EF 55 to 60%, normal RV function, no significant valvular disease.   BP Readings from Last 3 Encounters:  05/05/24 (!) 150/86  11/10/23 137/88  01/06/22 130/64     Past Medical History:  Diagnosis Date   Anginal pain    Arthritis    Coronary artery disease    Hepatitis    Hypertension    Sleep apnea     Past Surgical History:  Procedure Laterality Date   CARDIAC CATHETERIZATION     ~ 2005 stent to D1; 10/30/10: DES dRCA with 0% residual stenosis & cutting balloon angioplasty D1 for ISR with 70% stenosis reduced to 30% ([facility] [place], [place])   JOINT REPLACEMENT     partial right knee replacement   TONSILLECTOMY     TOTAL KNEE ARTHROPLASTY Left 11/04/2021   Procedure: LEFT TOTAL KNEE ARTHROPLASTY;  Surgeon: Jerri Kay HERO, MD;  Location: [facility] OR;  Service:  Orthopedics;  Laterality: Left;    Current Medications: Current Meds  Medication Sig   amLODipine  (NORVASC ) 5 MG tablet Take 5 mg by mouth daily.   aspirin  EC 81 MG tablet Take 1 tablet (81 mg total) by mouth 2 (two) times daily. To be taken after surgery to prevent blood clots   atorvastatin  (LIPITOR) 40 MG tablet Take 1 tablet (40 mg total) by mouth daily.   benazepril  (LOTENSIN ) 20 MG tablet Take 20 mg by mouth daily.   cholecalciferol (VITAMIN D3) 25 MCG (1000 UNIT) tablet Take 1,000 Units by mouth daily.   fluticasone (FLONASE) 50 MCG/ACT nasal spray Place 1 spray into both nostrils daily as needed for allergies or rhinitis.   lidocaine  (LIDODERM ) 5 % Place 1 patch onto the skin daily. Remove & Discard patch within 12 hours or as directed by MD   Multiple Vitamin (MULTIVITAMIN WITH MINERALS) TABS tablet Take 1 tablet by mouth daily.   omeprazole  (PRILOSEC) 20 MG capsule Take 1 capsule (20 mg total) by mouth daily. Take while on ibuprofen and steroids (Medrol  dose pack) to prevent ulcers.   [DISCONTINUED] simvastatin (ZOCOR) 40 MG tablet Take 40 mg by mouth at bedtime.     Allergies:   Patient has no known allergies.   Social History   Socioeconomic History   Marital status: Married  Spouse name: Not on file   Number of children: Not on file   Years of education: Not on file   Highest education level: Not on file  Occupational History   Not on file  Tobacco Use   Smoking status: Every Day    Current packs/day: 0.25    Types: Cigarettes   Smokeless tobacco: Never  Substance and Sexual Activity   Alcohol use: Yes    Alcohol/week: 10.0 standard drinks of alcohol    Types: 10 Shots of liquor per week   Drug use: Never   Sexual activity: Not on file  Other Topics Concern   Not on file  Social History Narrative   Not on file   Social Drivers of Health   Financial Resource Strain: Not on file  Food Insecurity: Low Risk  (02/10/2024)   Received from [facility]    Hunger Vital Sign    Within the past 12 months, you worried that your food would run out before you got money to buy more: Never true    Within the past 12 months, the food you bought just didn't last and you didn't have money to get more. : Never true  Transportation Needs: No Transportation Needs (02/10/2024)   Received from [facility]    In the past 12 months, has lack of reliable transportation kept you from medical appointments, meetings, work or from getting things needed for daily living? : No  Physical Activity: Not on file  Stress: Not on file  Social Connections: Not on file     Family History: The patient's family history is not on file.  ROS:   Please see the history of present illness.     All other systems reviewed and are negative.  EKGs/Labs/Other Studies Reviewed:    The following studies were reviewed today:   EKG:   05/05/2024: Normal sinus rhythm, rate 67, left axis deviation  Recent Labs: No results found for requested labs within last 365 days.  Recent Lipid Panel No results found for: CHOL, TRIG, HDL, CHOLHDL, VLDL, LDLCALC, LDLDIRECT  Physical Exam:    VS:  BP (!) 150/86 (BP Location: Right Arm, Patient Position: Sitting, Cuff Size: Normal)   Pulse 67   Ht 5' 9 (1.753 m)   Wt 229 lb 4.8 oz (104 kg)   SpO2 97%   BMI 33.86 kg/m     Wt Readings from Last 3 Encounters:  05/05/24 229 lb 4.8 oz (104 kg)  11/04/21 212 lb (96.2 kg)  10/24/21 214 lb (97.1 kg)     GEN:  Well nourished, well developed in no acute distress HEENT: Normal NECK: No JVD; No carotid bruits LYMPHATICS: No lymphadenopathy CARDIAC: RRR, no murmurs, rubs, gallops RESPIRATORY:  Clear to auscultation without rales, wheezing or rhonchi  ABDOMEN: Soft, non-tender, non-distended MUSCULOSKELETAL:  No edema; No deformity  SKIN: Warm and dry NEUROLOGIC:  Alert and oriented x 3 PSYCHIATRIC:  Normal affect   ASSESSMENT:    1. Coronary artery  disease involving native coronary artery of native heart without angina pectoris   2. Palpitations   3. Encounter to establish care   4. Double vision   5. Hyperlipidemia, unspecified hyperlipidemia type   6. Essential hypertension   7. Tobacco use    PLAN:    CAD: Underwent PCI with stent to diagonal over 20 years ago and then stent to RCA 15 years ago.  Echocardiogram 04/2023 showed EF 55 to 60%, normal RV function, no significant  valvular disease.  Denies any recent anginal symptoms.  - Continue aspirin  81 mg daily - On simvastatin 40 mg daily, will switch to atorvastatin  40 mg daily  Palpitations: Description concerning for arrhythmia, evaluate with Zio patch x 2 weeks  Hypertension: On amlodipine  5 mg daily and benazepril  20 mg daily.  BP elevated in clinic today, asked to check BP twice daily for next week and let us  know results  Hyperlipidemia: On simvastatin 40 mg daily.   LDL 67 on 08/2023, goal LDL less than 55 given history of PCI as above.  Will change to atorvastatin  40 mg daily.  Check fasting lipid panel in 3 months  Double vision: Reports intermittent episodes, will refer to neurology for evaluation  Tobacco use: Counseled on the risk of tobacco use and cessation strongly encouraged  OSA: Reports compliance with CPAP  RTC in 4 months  Medication Adjustments/Labs and Tests Ordered: Current medicines are reviewed at length with the patient today.  Concerns regarding medicines are outlined above.  Orders Placed This Encounter  Procedures   Lipid panel   Ambulatory referral to Neurology   LONG TERM MONITOR (3-14 DAYS)   EKG 12-Lead   Meds ordered this encounter  Medications   atorvastatin  (LIPITOR) 40 MG tablet    Sig: Take 1 tablet (40 mg total) by mouth daily.    Dispense:  90 tablet    Refill:  3    Patient Instructions  Medication Instructions:  Your physician has recommended you make the following change in your medication:  STOP: simvastatin  START:  atorvastatin  40 mg by mouth once daily  *If you need a refill on your cardiac medications before your next appointment, please call your pharmacy*  Lab Work: IN 3 MONTHS: Fasting lipid panel  If you have labs (blood work) drawn today and your tests are completely normal, you will receive your results only by: MyChart Message (if you have MyChart) OR A paper copy in the mail If you have any lab test that is abnormal or we need to change your treatment, we will call you to review the results.  Testing/Procedures: Your physician has requested that you wear a heart monitor.   Your physician has referred you to Neurology.    Your physician has requested that you check your blood pressure twice daily for 1 week and call in log in 1 week.  Follow-Up: At [facility], you and your health needs are our priority.  As part of our continuing mission to provide you with exceptional heart care, our providers are all part of one team.  This team includes your primary Cardiologist (physician) and Advanced Practice Providers or APPs (Physician Assistants and Nurse Practitioners) who all work together to provide you with the care you need, when you need it.  Your next appointment:   4 month(s)  Provider:   Lonni LITTIE Nanas, MD     Other Instructions GEOFFRY HEWS- Long Term Monitor Instructions  Your physician has requested you wear a ZIO patch monitor for 14 days.  This is a single patch monitor. [employer] supplies one patch monitor per enrollment. Additional stickers are not available. Please do not apply patch if you will be having a Nuclear Stress Test,   Cardiac CT, MRI, or Chest Xray during the period you would be wearing the  monitor. The patch cannot be worn during these tests. You cannot remove and re-apply the  ZIO XT patch monitor.  Your ZIO patch monitor will be mailed 3  day USPS to your address on file. It may take 3-5 days  to receive your monitor after you have been  enrolled.  Once you have received your monitor, please review the enclosed instructions. Your monitor  has already been registered assigning a specific monitor serial # to you.  Billing and Patient Assistance Program Information  We have supplied [employer] with any of your insurance information on file for billing purposes. [employer] offers a sliding scale Patient Assistance Program for patients that do not have  insurance, or whose insurance does not completely cover the cost of the ZIO monitor.  You must apply for the Patient Assistance Program to qualify for this discounted rate.  To apply, please call [employer] at [phone], select option 4, select option 2, ask to apply for  Patient Assistance Program. Meredeth will ask your household income, and how many people  are in your household. They will quote your out-of-pocket cost based on that information.  [employer] will also be able to set up a 17-month, interest-free payment plan if needed.  Applying the monitor   Shave hair from upper left chest.  Hold abrader disc by orange tab. Rub abrader in 40 strokes over the upper left chest as  indicated in your monitor instructions.  Clean area with 4 enclosed alcohol pads. Let dry.  Apply patch as indicated in monitor instructions. Patch will be placed under collarbone on left  side of chest with arrow pointing upward.  Rub patch adhesive wings for 2 minutes. Remove white label marked 1. Remove the white  label marked 2. Rub patch adhesive wings for 2 additional minutes.  While looking in a mirror, press and release button in center of patch. A small green light will  flash 3-4 times. This will be your only indicator that the monitor has been turned on.  Do not shower for the first 24 hours. You may shower after the first 24 hours.  Press the button if you feel a symptom. You will hear a small click. Record Date, Time and  Symptom in the Patient Logbook.  When you are ready to remove the  patch, follow instructions on the last 2 pages of Patient  Logbook. Stick patch monitor onto the last page of Patient Logbook.  Place Patient Logbook in the blue and white box. Use locking tab on box and tape box closed  securely. The blue and white box has prepaid postage on it. Please place it in the mailbox as  soon as possible. Your physician should have your test results approximately 7 days after the  monitor has been mailed back to [facility].  Call [facility] Customer Care at [phone] if you have questions regarding  your ZIO XT patch monitor. Call them immediately if you see an orange light blinking on your  monitor.  If your monitor falls off in less than 4 days, contact our Monitor department at [phone].  If your monitor becomes loose or falls off after 4 days call [employer] at [phone] for  suggestions on securing your monitor           Signed, Lonni LITTIE Nanas, MD  05/05/2024 6:08 PM    [facility]

## 2024-05-05 NOTE — Patient Instructions (Signed)
 Medication Instructions:  Your physician has recommended you make the following change in your medication:  STOP: simvastatin  START: atorvastatin  40 mg by mouth once daily  *If you need a refill on your cardiac medications before your next appointment, please call your pharmacy*  Lab Work: IN 3 MONTHS: Fasting lipid panel  If you have labs (blood work) drawn today and your tests are completely normal, you will receive your results only by: MyChart Message (if you have MyChart) OR A paper copy in the mail If you have any lab test that is abnormal or we need to change your treatment, we will call you to review the results.  Testing/Procedures: Your physician has requested that you wear a heart monitor.   Your physician has referred you to Neurology.    Your physician has requested that you check your blood pressure twice daily for 1 week and call in log in 1 week.  Follow-Up: At [facility], you and your health needs are our priority.  As part of our continuing mission to provide you with exceptional heart care, our providers are all part of one team.  This team includes your primary Cardiologist (physician) and Advanced Practice Providers or APPs (Physician Assistants and Nurse Practitioners) who all work together to provide you with the care you need, when you need it.  Your next appointment:   4 month(s)  Provider:   Lonni LITTIE Nanas, MD     Other Instructions GEOFFRY HEWS- Long Term Monitor Instructions  Your physician has requested you wear a ZIO patch monitor for 14 days.  This is a single patch monitor. [employer] supplies one patch monitor per enrollment. Additional stickers are not available. Please do not apply patch if you will be having a Nuclear Stress Test,   Cardiac CT, MRI, or Chest Xray during the period you would be wearing the  monitor. The patch cannot be worn during these tests. You cannot remove and re-apply the  ZIO XT patch monitor.  Your ZIO  patch monitor will be mailed 3 day USPS to your address on file. It may take 3-5 days  to receive your monitor after you have been enrolled.  Once you have received your monitor, please review the enclosed instructions. Your monitor  has already been registered assigning a specific monitor serial # to you.  Billing and Patient Assistance Program Information  We have supplied [employer] with any of your insurance information on file for billing purposes. [employer] offers a sliding scale Patient Assistance Program for patients that do not have  insurance, or whose insurance does not completely cover the cost of the ZIO monitor.  You must apply for the Patient Assistance Program to qualify for this discounted rate.  To apply, please call [employer] at [phone], select option 4, select option 2, ask to apply for  Patient Assistance Program. Meredeth will ask your household income, and how many people  are in your household. They will quote your out-of-pocket cost based on that information.  [employer] will also be able to set up a 28-month, interest-free payment plan if needed.  Applying the monitor   Shave hair from upper left chest.  Hold abrader disc by orange tab. Rub abrader in 40 strokes over the upper left chest as  indicated in your monitor instructions.  Clean area with 4 enclosed alcohol pads. Let dry.  Apply patch as indicated in monitor instructions. Patch will be placed under collarbone on left  side of chest with arrow pointing  upward.  Rub patch adhesive wings for 2 minutes. Remove white label marked 1. Remove the white  label marked 2. Rub patch adhesive wings for 2 additional minutes.  While looking in a mirror, press and release button in center of patch. A small green light will  flash 3-4 times. This will be your only indicator that the monitor has been turned on.  Do not shower for the first 24 hours. You may shower after the first 24 hours.  Press the button if you feel a  symptom. You will hear a small click. Record Date, Time and  Symptom in the Patient Logbook.  When you are ready to remove the patch, follow instructions on the last 2 pages of Patient  Logbook. Stick patch monitor onto the last page of Patient Logbook.  Place Patient Logbook in the blue and white box. Use locking tab on box and tape box closed  securely. The blue and white box has prepaid postage on it. Please place it in the mailbox as  soon as possible. Your physician should have your test results approximately 7 days after the  monitor has been mailed back to [facility].  Call [facility] Customer Care at [phone] if you have questions regarding  your ZIO XT patch monitor. Call them immediately if you see an orange light blinking on your  monitor.  If your monitor falls off in less than 4 days, contact our Monitor department at [phone].  If your monitor becomes loose or falls off after 4 days call [employer] at [phone] for  suggestions on securing your monitor

## 2024-05-05 NOTE — Progress Notes (Unsigned)
 Enrolled patient for a 14 day Zio XT  monitor to be mailed to patients home

## 2024-05-14 ENCOUNTER — Encounter: Payer: Self-pay | Admitting: Cardiology

## 2024-05-18 ENCOUNTER — Other Ambulatory Visit: Payer: Self-pay | Admitting: *Deleted

## 2024-05-18 DIAGNOSIS — I2510 Atherosclerotic heart disease of native coronary artery without angina pectoris: Secondary | ICD-10-CM

## 2024-05-18 DIAGNOSIS — E785 Hyperlipidemia, unspecified: Secondary | ICD-10-CM

## 2024-05-18 NOTE — Progress Notes (Signed)
 Order placed per Dr. Kate for LFT in addition  with lipid panel. Patient made aware per my chart message. And sent a message to call office or send my chart message for any questions. Made patient aware that labs needed to be drawn by 07/2024.

## 2024-05-18 NOTE — Telephone Encounter (Signed)
 Recommend checking LFTs when we recheck his lipid panel in 3 months (lipid panel is already ordered, please order LFTs as well).  Recommend limiting alcohol intake to no more than 2 drinks per day

## 2024-06-02 ENCOUNTER — Encounter: Payer: Self-pay | Admitting: Neurology

## 2024-06-07 ENCOUNTER — Encounter: Payer: Self-pay | Admitting: Cardiology

## 2024-06-07 NOTE — Telephone Encounter (Signed)
Please review readings.

## 2024-06-08 NOTE — Telephone Encounter (Signed)
 BP elevated, recommend increasing amlodipine  to 10 mg daily.  Check BP daily for next 2 weeks and let us  know results

## 2024-06-13 ENCOUNTER — Encounter: Payer: Self-pay | Admitting: Radiology

## 2024-07-03 ENCOUNTER — Ambulatory Visit: Payer: Self-pay | Admitting: Cardiology

## 2024-07-03 DIAGNOSIS — I471 Supraventricular tachycardia, unspecified: Secondary | ICD-10-CM

## 2024-07-03 DIAGNOSIS — R002 Palpitations: Secondary | ICD-10-CM

## 2024-07-03 DIAGNOSIS — Q249 Congenital malformation of heart, unspecified: Secondary | ICD-10-CM

## 2024-07-03 DIAGNOSIS — E785 Hyperlipidemia, unspecified: Secondary | ICD-10-CM

## 2024-07-03 DIAGNOSIS — I2510 Atherosclerotic heart disease of native coronary artery without angina pectoris: Secondary | ICD-10-CM

## 2024-07-03 DIAGNOSIS — I4729 Other ventricular tachycardia: Secondary | ICD-10-CM

## 2024-07-20 ENCOUNTER — Ambulatory Visit (HOSPITAL_COMMUNITY)
Admission: RE | Admit: 2024-07-20 | Discharge: 2024-07-20 | Attending: Cardiovascular Disease | Admitting: Cardiovascular Disease

## 2024-07-20 ENCOUNTER — Ambulatory Visit: Payer: Self-pay | Admitting: Cardiology

## 2024-07-20 DIAGNOSIS — I471 Supraventricular tachycardia, unspecified: Secondary | ICD-10-CM | POA: Diagnosis present

## 2024-07-20 DIAGNOSIS — I2510 Atherosclerotic heart disease of native coronary artery without angina pectoris: Secondary | ICD-10-CM | POA: Insufficient documentation

## 2024-07-20 DIAGNOSIS — I10 Essential (primary) hypertension: Secondary | ICD-10-CM

## 2024-07-20 DIAGNOSIS — R002 Palpitations: Secondary | ICD-10-CM | POA: Insufficient documentation

## 2024-07-20 DIAGNOSIS — Q249 Congenital malformation of heart, unspecified: Secondary | ICD-10-CM | POA: Diagnosis present

## 2024-07-20 LAB — ECHOCARDIOGRAM COMPLETE
Area-P 1/2: 2.62 cm2
MV VTI: 0.67 cm2
S' Lateral: 3.1 cm

## 2024-07-28 LAB — LIPID PANEL
Chol/HDL Ratio: 2.3 ratio (ref 0.0–5.0)
Cholesterol, Total: 159 mg/dL (ref 100–199)
HDL: 70 mg/dL (ref >39)
LDL Chol Calc (NIH): 77 mg/dL (ref 0–99)
Triglycerides: 61 mg/dL (ref 0–149)
VLDL Cholesterol Cal: 12 mg/dL (ref 5–40)

## 2024-07-28 LAB — HEPATIC FUNCTION PANEL
ALT: 23 IU/L (ref 0–44)
AST: 22 IU/L (ref 0–40)
Albumin: 4.7 g/dL (ref 3.9–4.9)
Alkaline Phosphatase: 78 IU/L (ref 47–123)
Bilirubin Total: 1.4 mg/dL — ABNORMAL HIGH (ref 0.0–1.2)
Bilirubin, Direct: 0.41 mg/dL — ABNORMAL HIGH (ref 0.00–0.40)
Total Protein: 7 g/dL (ref 6.0–8.5)

## 2024-08-01 MED ORDER — EZETIMIBE 10 MG PO TABS: 10.0000 mg | ORAL_TABLET | Freq: Every day | ORAL | 3 refills | Status: AC

## 2024-09-12 ENCOUNTER — Ambulatory Visit: Admitting: Neurology

## 2024-10-24 ENCOUNTER — Ambulatory Visit: Admitting: Neurology
